# Patient Record
Sex: Female | Born: 1940
Health system: Southern US, Community
[De-identification: ages and names within clinical notes are randomized; demographics above are authoritative.]

## PROBLEM LIST (undated history)

## (undated) DIAGNOSIS — Z91018 Allergy to other foods: Secondary | ICD-10-CM

## (undated) DIAGNOSIS — T8859XA Other complications of anesthesia, initial encounter: Secondary | ICD-10-CM

## (undated) DIAGNOSIS — K5790 Diverticulosis of intestine, part unspecified, without perforation or abscess without bleeding: Secondary | ICD-10-CM

## (undated) DIAGNOSIS — R519 Headache, unspecified: Secondary | ICD-10-CM

## (undated) HISTORY — PX: COLONOSCOPY: SHX174

## (undated) HISTORY — PX: APPENDECTOMY: SHX54

## (undated) HISTORY — PX: TONSILLECTOMY: SUR1361

---

## 2002-11-17 ENCOUNTER — Encounter: Payer: Self-pay | Admitting: Emergency Medicine

## 2002-11-17 ENCOUNTER — Observation Stay (HOSPITAL_COMMUNITY): Admission: EM | Admit: 2002-11-17 | Discharge: 2002-11-18 | Payer: Self-pay | Admitting: Emergency Medicine

## 2002-11-18 ENCOUNTER — Encounter: Payer: Self-pay | Admitting: *Deleted

## 2002-11-18 ENCOUNTER — Encounter: Payer: Self-pay | Admitting: Family Medicine

## 2002-11-28 ENCOUNTER — Encounter: Payer: Self-pay | Admitting: Family Medicine

## 2002-11-28 ENCOUNTER — Encounter (HOSPITAL_COMMUNITY): Admission: RE | Admit: 2002-11-28 | Discharge: 2002-12-28 | Payer: Self-pay | Admitting: Family Medicine

## 2002-11-29 ENCOUNTER — Ambulatory Visit (HOSPITAL_COMMUNITY): Admission: RE | Admit: 2002-11-29 | Discharge: 2002-11-29 | Payer: Self-pay | Admitting: Family Medicine

## 2002-11-29 ENCOUNTER — Encounter: Payer: Self-pay | Admitting: Family Medicine

## 2005-01-16 ENCOUNTER — Ambulatory Visit (HOSPITAL_COMMUNITY): Admission: RE | Admit: 2005-01-16 | Discharge: 2005-01-16 | Payer: Self-pay | Admitting: Family Medicine

## 2005-03-05 ENCOUNTER — Ambulatory Visit: Payer: Self-pay | Admitting: Gastroenterology

## 2005-03-06 ENCOUNTER — Ambulatory Visit: Payer: Self-pay | Admitting: Gastroenterology

## 2005-03-17 ENCOUNTER — Ambulatory Visit: Payer: Self-pay | Admitting: Gastroenterology

## 2006-11-13 ENCOUNTER — Ambulatory Visit (HOSPITAL_COMMUNITY): Admission: RE | Admit: 2006-11-13 | Discharge: 2006-11-13 | Payer: Self-pay | Admitting: Family Medicine

## 2010-10-04 NOTE — Consult Note (Signed)
NAME:  Bridget Marquez, Bridget Marquez                          ACCOUNT NO.:  [id number]   MEDICAL RECORD NO.:  [id number]                   PATIENT TYPE:  INP   LOCATION:  A206                                 FACILITY:  [facility]   PHYSICIAN:  Vida Roller, M.D.                DATE OF BIRTH:  1940-11-02   DATE OF CONSULTATION:  11/18/2002  DATE OF DISCHARGE:                                   CONSULTATION   PRIMARY CARE Cimone Fahey:  Patrica Duel, M.D.   CARDIOLOGIST:  She has no cardiology evaluation prior.   HISTORY OF PRESENT ILLNESS:  Mrs. Altemose is a 70 year old woman with no  known coronary disease who presents with atypical chest discomfort which is  related to home environment stress.  She states she is caring for her  elderly, debilitated mother-in-law and finds that the stress involved in  that is probably what has brought on the chest discomfort.  She has no  exercise-related component.  It is a central fullness in her chest which has  lasted for many hours.  Was evaluated in the ER, given two sublingual  nitroglycerin and the second sublingual nitroglycerin resolved the pain.  There is no exertional component to the pain.  There is no PND, no  orthopnea, no lower extremity edema.  Minimal radiation to the pain.  No  diaphoresis or palpitations associated with it.   PAST MEDICAL HISTORY:  Significant for an abdominal hernia secondary to  trauma, an appendectomy at age 69, tonsillectomy at age 69.  She has no  significant coronary risk factors aside from a questionable family history  in her two siblings.   MEDICATIONS:  Medications prior to admission were multivitamins, co-enzyme  Q, glucosamine, gingko biloba.  Here in the hospital she is on Protonix 40  mg IV daily.   ALLERGIES:  1. PENICILLIN.  2. EGGS.  3. WHOLE WHEAT.  4. PEANUTS.  She thinks that she had some peanuts prior to the onset of the     discomfort but she is not sure.   SOCIAL HISTORY:  She lives in [place] with her  husband.  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  She  has one son who is alive and well, has reactive airways disease and a  history of intestinal parasites.  Mother died at age 38 of a myocardial  infarction.  Father died at age 23 of a stroke.  She has one brother who is  54 who is healthy, one brother who died at age 23 of suicide.  She has two  sisters both of whom are ill - one with a multitude of medical problems to  include a question of cardiac disease and another sister who has had  multiple traumas who also potentially has coronary disease although it is  uncertain.   REVIEW OF SYSTEMS:  Noncontributory.   PHYSICAL EXAMINATION:  GENERAL:  She is a well-developed, well-nourished  white female in no apparent distress who is alert and oriented x4.  VITAL SIGNS:  Pulse 68, respiratory rate 18, blood pressure 98/46, she is  saturating at 100% on 2 liters room air.  She weighs 137 pounds.  HEENT:  Normal.  NECK:  Without significant bruits or JVD.  She has no lymphadenopathy.  CHEST:  Clear to auscultation.  CARDIAC:  Reveals a regular rate and rhythm without murmurs, rubs, or  gallops.  ABDOMEN:  Soft, nontender, with normal active bowel sounds.  GENITOURINARY AND RECTAL:  Deferred.  EXTREMITIES:  Without significant clubbing, cyanosis, or edema.  NEUROLOGIC:  Nonfocal.   LABORATORY DATA:  Chest x-ray shows minor bronchitic changes but is  otherwise normal.  Her electrocardiogram reveals sinus rhythm at a rate of  60 with an incomplete right bundle-branch block.  She has inverted T waves  in the anterior precordium which are consistent with potentially a  persistent juvenile pattern.  There is no left axis deviation and the axis  is 30.  PR, QRS, and QT intervals are all normal and she has no ST-T wave  changes concerning for ischemia.   White blood cell count 5.5; H&H are 13 and 39 with a platelet count of  228,000.   Sodium 138, potassium 4.0, chloride 103, bicarb 28, BUN 13,  creatinine 0.9, blood sugar 108 nonfasting.  Her liver function studies are  normal.  Three sets are cardiac enzymes are inconsistent with acute  myocardial infarction.  Her D-dimer is 0.23.  PT, PTT, and INR are all  normal.   So, essentially we have a woman with atypical chest pain who has ruled out  for a myocardial infarction, has a question of cholesterol elevation as well  as a family history.  Recommendation is to get an exercise Cardiolite.  If  this is normal then would not pursue an ischemic etiology to the discomfort.  Probably get a fasting cholesterol to judicate whether or not she has  hypercholesterolemia and I would add a low dose of aspirin in the interim  period of time.  Understand she is also undergoing a GI workup for the  etiology of this discomfort.                                               Vida Roller, M.D.    JH/MEDQ  D:  11/18/2002  T:  11/18/2002  Job:  [id number]

## 2010-10-04 NOTE — H&P (Signed)
NAME:  Bridget Marquez, Bridget Marquez                          ACCOUNT NO.:  [id number]   MEDICAL RECORD NO.:  [id number]                   PATIENT TYPE:  INP   LOCATION:  A206                                 FACILITY:  [facility]   PHYSICIAN:  Patrica Duel, M.D.                 DATE OF BIRTH:  [birth year]   DATE OF ADMISSION:  11/17/2002  DATE OF DISCHARGE:                                HISTORY & PHYSICAL   CHIEF COMPLAINT:  Chest pain and shortness of breath.   HISTORY OF PRESENT ILLNESS:  This is a 70 year old female with a history of  ? hiatal hernia.  She denies chest pain approximately 15 years ago and  underwent cardiac catheterization at [facility].  This was negative.  She  has a healthy lifestyle and otherwise few risk factors for coronary disease.   The patient has a one-month history of intermittent nausea, left anterior  and epigastric abdominal pain with occasional diaphoresis.  She also has  decreased exercise tolerance.  She has no absolutely reproducible symptoms.  She noticed the onset after eating peanuts.  She has a very stressful  existence, caring for her ill mother-in-law.   The patient presented to the emergency room with the above complaints.  Workup there was essentially benign except for poor R-wave progression noted  on her EKG.  Chest x-ray, etc., were benign.  Cardiac enzymes negative.  The  patient was admitted for rule out MI protocol, further evaluation as  indicated.   There is no history of headache, neurologic deficits, diarrhea, hematemesis,  hematochezia, genitourinary symptoms.  No history of neurologic deficits.   Since admission the patient is much better after having received 0.5 mg of  Xanax, nitroglycerin sublingually x2, as well as Protonix IV.   CURRENT MEDICATIONS:  None except for vitamins including CoQ10.   ALLERGIES:  PENICILLIN.   PAST MEDICAL HISTORY:  As noted above.  She is also status post  appendectomy.   SOCIAL HISTORY:  Nonsmoker,  nondrinker.  Very active physically.   REVIEW OF SYSTEMS:  Negative except as mentioned.   FAMILY HISTORY:  Noncontributory.   PHYSICAL EXAMINATION:  GENERAL:  Very pleasant female who is alert and  oriented, in no acute distress at this time.  VITAL SIGNS:  Within normal limits.  HEENT:  Normocephalic, atraumatic.  Pupils are equal.  Ears, nose, throat  are benign.  NECK:  Supple.  No bruits noted.  No thyromegaly or lymphadenopathy.  LUNGS:  Clear.  HEART:  Heart sounds are normal.  Without murmurs, rubs, or gallops.  ABDOMEN:  Nontender, nondistended.  There are no masses or organomegaly  demonstrated.  EXTREMITIES:  No clubbing, cyanosis, or edema.  NEUROLOGIC:  Within normal limits.   LABORATORY DATA:  All laboratories within normal limits including CBC,  protime, liver functions, MET-7, and cardiac enzymes.   EKG:  Negative for acute change although there is poor  R-wave progression.  QT interval is short for rate.   Chest x-ray:  Minor bronchitic changes.  Negative for other abnormality.   ASSESSMENT:  Symptom complex as described above, questionable etiology.  Consider gastrointestinal source.  Also must rule out coronary disease.  Consider gastrointestinal source such as gastroesophageal reflux disease  with esophagitis, spasm, gallbladder disease, or other.   PLAN:  Empiric PPIs.  Cardiac evaluation with rule out MI protocol, to  proceed with ultrasound of the gallbladder and possible GI consult with  upper endoscopy or whatever is deemed appropriate after cardiac evaluation  is at least initiated.  Will follow and treat expectantly.                                               Patrica Duel, M.D.    MC/MEDQ  D:  11/17/2002  T:  11/18/2002  Job:  [id number]

## 2015-11-22 DIAGNOSIS — Z6824 Body mass index (BMI) 24.0-24.9, adult: Secondary | ICD-10-CM | POA: Diagnosis not present

## 2015-11-22 DIAGNOSIS — Z299 Encounter for prophylactic measures, unspecified: Secondary | ICD-10-CM | POA: Diagnosis not present

## 2015-11-22 DIAGNOSIS — Z1211 Encounter for screening for malignant neoplasm of colon: Secondary | ICD-10-CM | POA: Diagnosis not present

## 2015-11-22 DIAGNOSIS — Z7189 Other specified counseling: Secondary | ICD-10-CM | POA: Diagnosis not present

## 2015-11-22 DIAGNOSIS — Z1389 Encounter for screening for other disorder: Secondary | ICD-10-CM | POA: Diagnosis not present

## 2015-11-22 DIAGNOSIS — Z0000 Encounter for general adult medical examination without abnormal findings: Secondary | ICD-10-CM | POA: Diagnosis not present

## 2015-11-27 DIAGNOSIS — Z79899 Other long term (current) drug therapy: Secondary | ICD-10-CM | POA: Diagnosis not present

## 2015-11-27 DIAGNOSIS — R5383 Other fatigue: Secondary | ICD-10-CM | POA: Diagnosis not present

## 2016-12-02 DIAGNOSIS — Z713 Dietary counseling and surveillance: Secondary | ICD-10-CM | POA: Diagnosis not present

## 2016-12-02 DIAGNOSIS — Z1211 Encounter for screening for malignant neoplasm of colon: Secondary | ICD-10-CM | POA: Diagnosis not present

## 2016-12-02 DIAGNOSIS — Z6822 Body mass index (BMI) 22.0-22.9, adult: Secondary | ICD-10-CM | POA: Diagnosis not present

## 2016-12-02 DIAGNOSIS — R5383 Other fatigue: Secondary | ICD-10-CM | POA: Diagnosis not present

## 2016-12-02 DIAGNOSIS — Z79899 Other long term (current) drug therapy: Secondary | ICD-10-CM | POA: Diagnosis not present

## 2016-12-02 DIAGNOSIS — Z0000 Encounter for general adult medical examination without abnormal findings: Secondary | ICD-10-CM | POA: Diagnosis not present

## 2016-12-02 DIAGNOSIS — Z1389 Encounter for screening for other disorder: Secondary | ICD-10-CM | POA: Diagnosis not present

## 2016-12-02 DIAGNOSIS — Z7189 Other specified counseling: Secondary | ICD-10-CM | POA: Diagnosis not present

## 2016-12-02 DIAGNOSIS — Z299 Encounter for prophylactic measures, unspecified: Secondary | ICD-10-CM | POA: Diagnosis not present

## 2016-12-03 DIAGNOSIS — R5383 Other fatigue: Secondary | ICD-10-CM | POA: Diagnosis not present

## 2016-12-03 DIAGNOSIS — Z79899 Other long term (current) drug therapy: Secondary | ICD-10-CM | POA: Diagnosis not present

## 2017-01-23 DIAGNOSIS — M546 Pain in thoracic spine: Secondary | ICD-10-CM | POA: Diagnosis not present

## 2017-01-23 DIAGNOSIS — S336XXA Sprain of sacroiliac joint, initial encounter: Secondary | ICD-10-CM | POA: Diagnosis not present

## 2017-01-23 DIAGNOSIS — M9903 Segmental and somatic dysfunction of lumbar region: Secondary | ICD-10-CM | POA: Diagnosis not present

## 2017-01-23 DIAGNOSIS — M47816 Spondylosis without myelopathy or radiculopathy, lumbar region: Secondary | ICD-10-CM | POA: Diagnosis not present

## 2017-01-23 DIAGNOSIS — M9901 Segmental and somatic dysfunction of cervical region: Secondary | ICD-10-CM | POA: Diagnosis not present

## 2017-01-23 DIAGNOSIS — S39012A Strain of muscle, fascia and tendon of lower back, initial encounter: Secondary | ICD-10-CM | POA: Diagnosis not present

## 2017-01-23 DIAGNOSIS — M9902 Segmental and somatic dysfunction of thoracic region: Secondary | ICD-10-CM | POA: Diagnosis not present

## 2017-01-23 DIAGNOSIS — M47812 Spondylosis without myelopathy or radiculopathy, cervical region: Secondary | ICD-10-CM | POA: Diagnosis not present

## 2017-09-29 DIAGNOSIS — L821 Other seborrheic keratosis: Secondary | ICD-10-CM | POA: Diagnosis not present

## 2017-09-29 DIAGNOSIS — D229 Melanocytic nevi, unspecified: Secondary | ICD-10-CM | POA: Diagnosis not present

## 2017-09-29 DIAGNOSIS — C44612 Basal cell carcinoma of skin of right upper limb, including shoulder: Secondary | ICD-10-CM | POA: Diagnosis not present

## 2017-10-29 DIAGNOSIS — C44612 Basal cell carcinoma of skin of right upper limb, including shoulder: Secondary | ICD-10-CM | POA: Diagnosis not present

## 2017-12-09 DIAGNOSIS — Z7189 Other specified counseling: Secondary | ICD-10-CM | POA: Diagnosis not present

## 2017-12-09 DIAGNOSIS — Z6821 Body mass index (BMI) 21.0-21.9, adult: Secondary | ICD-10-CM | POA: Diagnosis not present

## 2017-12-09 DIAGNOSIS — Z1211 Encounter for screening for malignant neoplasm of colon: Secondary | ICD-10-CM | POA: Diagnosis not present

## 2017-12-09 DIAGNOSIS — Z0000 Encounter for general adult medical examination without abnormal findings: Secondary | ICD-10-CM | POA: Diagnosis not present

## 2017-12-09 DIAGNOSIS — Z1339 Encounter for screening examination for other mental health and behavioral disorders: Secondary | ICD-10-CM | POA: Diagnosis not present

## 2017-12-09 DIAGNOSIS — E2839 Other primary ovarian failure: Secondary | ICD-10-CM | POA: Diagnosis not present

## 2017-12-09 DIAGNOSIS — R5383 Other fatigue: Secondary | ICD-10-CM | POA: Diagnosis not present

## 2017-12-09 DIAGNOSIS — Z299 Encounter for prophylactic measures, unspecified: Secondary | ICD-10-CM | POA: Diagnosis not present

## 2017-12-09 DIAGNOSIS — E7800 Pure hypercholesterolemia, unspecified: Secondary | ICD-10-CM | POA: Diagnosis not present

## 2017-12-09 DIAGNOSIS — Z1331 Encounter for screening for depression: Secondary | ICD-10-CM | POA: Diagnosis not present

## 2017-12-10 DIAGNOSIS — E7800 Pure hypercholesterolemia, unspecified: Secondary | ICD-10-CM | POA: Diagnosis not present

## 2017-12-10 DIAGNOSIS — R5383 Other fatigue: Secondary | ICD-10-CM | POA: Diagnosis not present

## 2017-12-10 DIAGNOSIS — Z79899 Other long term (current) drug therapy: Secondary | ICD-10-CM | POA: Diagnosis not present

## 2018-07-22 DIAGNOSIS — R42 Dizziness and giddiness: Secondary | ICD-10-CM | POA: Diagnosis not present

## 2018-07-22 DIAGNOSIS — R531 Weakness: Secondary | ICD-10-CM | POA: Diagnosis not present

## 2018-07-22 DIAGNOSIS — R61 Generalized hyperhidrosis: Secondary | ICD-10-CM | POA: Diagnosis not present

## 2018-07-22 DIAGNOSIS — H538 Other visual disturbances: Secondary | ICD-10-CM | POA: Diagnosis not present

## 2018-07-22 DIAGNOSIS — R079 Chest pain, unspecified: Secondary | ICD-10-CM | POA: Diagnosis not present

## 2018-07-22 DIAGNOSIS — K5730 Diverticulosis of large intestine without perforation or abscess without bleeding: Secondary | ICD-10-CM | POA: Diagnosis not present

## 2018-07-22 DIAGNOSIS — R0789 Other chest pain: Secondary | ICD-10-CM | POA: Diagnosis not present

## 2018-07-22 DIAGNOSIS — R1013 Epigastric pain: Secondary | ICD-10-CM | POA: Diagnosis not present

## 2018-07-22 DIAGNOSIS — J479 Bronchiectasis, uncomplicated: Secondary | ICD-10-CM | POA: Diagnosis not present

## 2018-07-22 DIAGNOSIS — K8020 Calculus of gallbladder without cholecystitis without obstruction: Secondary | ICD-10-CM | POA: Diagnosis not present

## 2018-07-22 DIAGNOSIS — K5900 Constipation, unspecified: Secondary | ICD-10-CM | POA: Diagnosis not present

## 2018-12-15 DIAGNOSIS — Z0000 Encounter for general adult medical examination without abnormal findings: Secondary | ICD-10-CM | POA: Diagnosis not present

## 2018-12-15 DIAGNOSIS — E7800 Pure hypercholesterolemia, unspecified: Secondary | ICD-10-CM | POA: Diagnosis not present

## 2018-12-15 DIAGNOSIS — Z1211 Encounter for screening for malignant neoplasm of colon: Secondary | ICD-10-CM | POA: Diagnosis not present

## 2018-12-15 DIAGNOSIS — E2839 Other primary ovarian failure: Secondary | ICD-10-CM | POA: Diagnosis not present

## 2018-12-15 DIAGNOSIS — R5383 Other fatigue: Secondary | ICD-10-CM | POA: Diagnosis not present

## 2018-12-15 DIAGNOSIS — Z6821 Body mass index (BMI) 21.0-21.9, adult: Secondary | ICD-10-CM | POA: Diagnosis not present

## 2018-12-15 DIAGNOSIS — Z1339 Encounter for screening examination for other mental health and behavioral disorders: Secondary | ICD-10-CM | POA: Diagnosis not present

## 2018-12-15 DIAGNOSIS — Z1331 Encounter for screening for depression: Secondary | ICD-10-CM | POA: Diagnosis not present

## 2018-12-15 DIAGNOSIS — Z299 Encounter for prophylactic measures, unspecified: Secondary | ICD-10-CM | POA: Diagnosis not present

## 2018-12-15 DIAGNOSIS — Z7189 Other specified counseling: Secondary | ICD-10-CM | POA: Diagnosis not present

## 2018-12-17 DIAGNOSIS — R5383 Other fatigue: Secondary | ICD-10-CM | POA: Diagnosis not present

## 2018-12-17 DIAGNOSIS — E7800 Pure hypercholesterolemia, unspecified: Secondary | ICD-10-CM | POA: Diagnosis not present

## 2018-12-17 DIAGNOSIS — Z79899 Other long term (current) drug therapy: Secondary | ICD-10-CM | POA: Diagnosis not present

## 2018-12-17 DIAGNOSIS — E559 Vitamin D deficiency, unspecified: Secondary | ICD-10-CM | POA: Diagnosis not present

## 2019-12-21 DIAGNOSIS — H1131 Conjunctival hemorrhage, right eye: Secondary | ICD-10-CM | POA: Diagnosis not present

## 2019-12-21 DIAGNOSIS — H2513 Age-related nuclear cataract, bilateral: Secondary | ICD-10-CM | POA: Diagnosis not present

## 2019-12-21 DIAGNOSIS — H524 Presbyopia: Secondary | ICD-10-CM | POA: Diagnosis not present

## 2019-12-21 DIAGNOSIS — H52221 Regular astigmatism, right eye: Secondary | ICD-10-CM | POA: Diagnosis not present

## 2019-12-21 DIAGNOSIS — H5203 Hypermetropia, bilateral: Secondary | ICD-10-CM | POA: Diagnosis not present

## 2019-12-28 DIAGNOSIS — Z299 Encounter for prophylactic measures, unspecified: Secondary | ICD-10-CM | POA: Diagnosis not present

## 2019-12-28 DIAGNOSIS — I6529 Occlusion and stenosis of unspecified carotid artery: Secondary | ICD-10-CM | POA: Diagnosis not present

## 2019-12-28 DIAGNOSIS — E559 Vitamin D deficiency, unspecified: Secondary | ICD-10-CM | POA: Diagnosis not present

## 2019-12-28 DIAGNOSIS — R5383 Other fatigue: Secondary | ICD-10-CM | POA: Diagnosis not present

## 2019-12-28 DIAGNOSIS — Z7189 Other specified counseling: Secondary | ICD-10-CM | POA: Diagnosis not present

## 2019-12-28 DIAGNOSIS — Z1331 Encounter for screening for depression: Secondary | ICD-10-CM | POA: Diagnosis not present

## 2019-12-28 DIAGNOSIS — Z0000 Encounter for general adult medical examination without abnormal findings: Secondary | ICD-10-CM | POA: Diagnosis not present

## 2019-12-28 DIAGNOSIS — Z1339 Encounter for screening examination for other mental health and behavioral disorders: Secondary | ICD-10-CM | POA: Diagnosis not present

## 2019-12-29 DIAGNOSIS — E7800 Pure hypercholesterolemia, unspecified: Secondary | ICD-10-CM | POA: Diagnosis not present

## 2019-12-29 DIAGNOSIS — Z79899 Other long term (current) drug therapy: Secondary | ICD-10-CM | POA: Diagnosis not present

## 2019-12-29 DIAGNOSIS — R5383 Other fatigue: Secondary | ICD-10-CM | POA: Diagnosis not present

## 2019-12-29 DIAGNOSIS — E559 Vitamin D deficiency, unspecified: Secondary | ICD-10-CM | POA: Diagnosis not present

## 2020-01-16 DIAGNOSIS — I6529 Occlusion and stenosis of unspecified carotid artery: Secondary | ICD-10-CM | POA: Diagnosis not present

## 2020-02-10 DIAGNOSIS — R471 Dysarthria and anarthria: Secondary | ICD-10-CM | POA: Diagnosis not present

## 2020-02-10 DIAGNOSIS — R519 Headache, unspecified: Secondary | ICD-10-CM | POA: Diagnosis not present

## 2020-02-10 DIAGNOSIS — Z299 Encounter for prophylactic measures, unspecified: Secondary | ICD-10-CM | POA: Diagnosis not present

## 2020-02-16 DIAGNOSIS — G43909 Migraine, unspecified, not intractable, without status migrainosus: Secondary | ICD-10-CM | POA: Diagnosis not present

## 2020-02-27 DIAGNOSIS — G500 Trigeminal neuralgia: Secondary | ICD-10-CM | POA: Diagnosis not present

## 2020-02-27 DIAGNOSIS — I739 Peripheral vascular disease, unspecified: Secondary | ICD-10-CM | POA: Diagnosis not present

## 2020-02-27 DIAGNOSIS — H9319 Tinnitus, unspecified ear: Secondary | ICD-10-CM | POA: Diagnosis not present

## 2020-02-27 DIAGNOSIS — Z299 Encounter for prophylactic measures, unspecified: Secondary | ICD-10-CM | POA: Diagnosis not present

## 2020-03-16 DIAGNOSIS — R21 Rash and other nonspecific skin eruption: Secondary | ICD-10-CM | POA: Diagnosis not present

## 2020-03-16 DIAGNOSIS — W57XXXA Bitten or stung by nonvenomous insect and other nonvenomous arthropods, initial encounter: Secondary | ICD-10-CM | POA: Diagnosis not present

## 2020-03-16 DIAGNOSIS — Z2821 Immunization not carried out because of patient refusal: Secondary | ICD-10-CM | POA: Diagnosis not present

## 2020-03-16 DIAGNOSIS — I739 Peripheral vascular disease, unspecified: Secondary | ICD-10-CM | POA: Diagnosis not present

## 2020-03-16 DIAGNOSIS — Z91014 Allergy to mammalian meats: Secondary | ICD-10-CM | POA: Diagnosis not present

## 2020-03-16 DIAGNOSIS — Z299 Encounter for prophylactic measures, unspecified: Secondary | ICD-10-CM | POA: Diagnosis not present

## 2020-05-30 DIAGNOSIS — Z299 Encounter for prophylactic measures, unspecified: Secondary | ICD-10-CM | POA: Diagnosis not present

## 2020-05-30 DIAGNOSIS — K219 Gastro-esophageal reflux disease without esophagitis: Secondary | ICD-10-CM | POA: Diagnosis not present

## 2020-05-30 DIAGNOSIS — J069 Acute upper respiratory infection, unspecified: Secondary | ICD-10-CM | POA: Diagnosis not present

## 2020-07-20 DIAGNOSIS — R109 Unspecified abdominal pain: Secondary | ICD-10-CM | POA: Diagnosis not present

## 2020-07-20 DIAGNOSIS — Z299 Encounter for prophylactic measures, unspecified: Secondary | ICD-10-CM | POA: Diagnosis not present

## 2020-07-20 DIAGNOSIS — R519 Headache, unspecified: Secondary | ICD-10-CM | POA: Diagnosis not present

## 2020-07-20 DIAGNOSIS — I739 Peripheral vascular disease, unspecified: Secondary | ICD-10-CM | POA: Diagnosis not present

## 2020-07-27 DIAGNOSIS — I672 Cerebral atherosclerosis: Secondary | ICD-10-CM | POA: Diagnosis not present

## 2020-07-27 DIAGNOSIS — R109 Unspecified abdominal pain: Secondary | ICD-10-CM | POA: Diagnosis not present

## 2020-07-27 DIAGNOSIS — Z20822 Contact with and (suspected) exposure to covid-19: Secondary | ICD-10-CM | POA: Diagnosis not present

## 2020-07-27 DIAGNOSIS — R4701 Aphasia: Secondary | ICD-10-CM | POA: Diagnosis not present

## 2020-07-27 DIAGNOSIS — Z882 Allergy status to sulfonamides status: Secondary | ICD-10-CM | POA: Diagnosis not present

## 2020-07-27 DIAGNOSIS — R4182 Altered mental status, unspecified: Secondary | ICD-10-CM | POA: Diagnosis not present

## 2020-07-27 DIAGNOSIS — R4789 Other speech disturbances: Secondary | ICD-10-CM | POA: Diagnosis not present

## 2020-07-27 DIAGNOSIS — Z881 Allergy status to other antibiotic agents status: Secondary | ICD-10-CM | POA: Diagnosis not present

## 2020-07-27 DIAGNOSIS — R1110 Vomiting, unspecified: Secondary | ICD-10-CM | POA: Diagnosis not present

## 2020-07-27 DIAGNOSIS — Z887 Allergy status to serum and vaccine status: Secondary | ICD-10-CM | POA: Diagnosis not present

## 2020-07-27 DIAGNOSIS — R519 Headache, unspecified: Secondary | ICD-10-CM | POA: Diagnosis not present

## 2020-07-27 DIAGNOSIS — I6523 Occlusion and stenosis of bilateral carotid arteries: Secondary | ICD-10-CM | POA: Diagnosis not present

## 2020-07-27 DIAGNOSIS — Z880 Allergy status to penicillin: Secondary | ICD-10-CM | POA: Diagnosis not present

## 2020-07-27 DIAGNOSIS — G43709 Chronic migraine without aura, not intractable, without status migrainosus: Secondary | ICD-10-CM | POA: Diagnosis not present

## 2020-07-27 DIAGNOSIS — K862 Cyst of pancreas: Secondary | ICD-10-CM | POA: Diagnosis not present

## 2020-07-27 DIAGNOSIS — K8020 Calculus of gallbladder without cholecystitis without obstruction: Secondary | ICD-10-CM | POA: Diagnosis not present

## 2020-07-27 DIAGNOSIS — E876 Hypokalemia: Secondary | ICD-10-CM | POA: Diagnosis not present

## 2020-07-27 DIAGNOSIS — K7689 Other specified diseases of liver: Secondary | ICD-10-CM | POA: Diagnosis not present

## 2020-07-27 DIAGNOSIS — I700 Atherosclerosis of aorta: Secondary | ICD-10-CM | POA: Diagnosis not present

## 2020-07-27 DIAGNOSIS — K219 Gastro-esophageal reflux disease without esophagitis: Secondary | ICD-10-CM | POA: Diagnosis not present

## 2020-07-28 DIAGNOSIS — Z91014 Allergy to mammalian meats: Secondary | ICD-10-CM | POA: Diagnosis not present

## 2020-07-28 DIAGNOSIS — K862 Cyst of pancreas: Secondary | ICD-10-CM | POA: Diagnosis present

## 2020-07-28 DIAGNOSIS — G43909 Migraine, unspecified, not intractable, without status migrainosus: Secondary | ICD-10-CM | POA: Diagnosis not present

## 2020-07-28 DIAGNOSIS — K7689 Other specified diseases of liver: Secondary | ICD-10-CM | POA: Diagnosis present

## 2020-07-28 DIAGNOSIS — Z887 Allergy status to serum and vaccine status: Secondary | ICD-10-CM | POA: Diagnosis not present

## 2020-07-28 DIAGNOSIS — E876 Hypokalemia: Secondary | ICD-10-CM | POA: Diagnosis not present

## 2020-07-28 DIAGNOSIS — Z91018 Allergy to other foods: Secondary | ICD-10-CM | POA: Diagnosis not present

## 2020-07-28 DIAGNOSIS — I700 Atherosclerosis of aorta: Secondary | ICD-10-CM | POA: Diagnosis not present

## 2020-07-28 DIAGNOSIS — G43709 Chronic migraine without aura, not intractable, without status migrainosus: Secondary | ICD-10-CM | POA: Diagnosis present

## 2020-07-28 DIAGNOSIS — R4789 Other speech disturbances: Secondary | ICD-10-CM | POA: Diagnosis not present

## 2020-07-28 DIAGNOSIS — I672 Cerebral atherosclerosis: Secondary | ICD-10-CM | POA: Diagnosis not present

## 2020-07-28 DIAGNOSIS — I6523 Occlusion and stenosis of bilateral carotid arteries: Secondary | ICD-10-CM | POA: Diagnosis not present

## 2020-07-28 DIAGNOSIS — K8020 Calculus of gallbladder without cholecystitis without obstruction: Secondary | ICD-10-CM | POA: Diagnosis not present

## 2020-07-28 DIAGNOSIS — K219 Gastro-esophageal reflux disease without esophagitis: Secondary | ICD-10-CM | POA: Diagnosis present

## 2020-07-28 DIAGNOSIS — R7989 Other specified abnormal findings of blood chemistry: Secondary | ICD-10-CM | POA: Diagnosis not present

## 2020-07-28 DIAGNOSIS — R1110 Vomiting, unspecified: Secondary | ICD-10-CM | POA: Diagnosis not present

## 2020-07-28 DIAGNOSIS — R4701 Aphasia: Secondary | ICD-10-CM | POA: Diagnosis not present

## 2020-07-28 DIAGNOSIS — Z882 Allergy status to sulfonamides status: Secondary | ICD-10-CM | POA: Diagnosis not present

## 2020-07-28 DIAGNOSIS — R109 Unspecified abdominal pain: Secondary | ICD-10-CM | POA: Diagnosis not present

## 2020-07-28 DIAGNOSIS — Z20822 Contact with and (suspected) exposure to covid-19: Secondary | ICD-10-CM | POA: Diagnosis not present

## 2020-07-28 DIAGNOSIS — Z880 Allergy status to penicillin: Secondary | ICD-10-CM | POA: Diagnosis not present

## 2020-07-28 DIAGNOSIS — R519 Headache, unspecified: Secondary | ICD-10-CM | POA: Diagnosis not present

## 2020-07-28 DIAGNOSIS — Z8616 Personal history of COVID-19: Secondary | ICD-10-CM | POA: Diagnosis not present

## 2020-07-28 DIAGNOSIS — R4182 Altered mental status, unspecified: Secondary | ICD-10-CM | POA: Diagnosis not present

## 2020-07-28 DIAGNOSIS — Z881 Allergy status to other antibiotic agents status: Secondary | ICD-10-CM | POA: Diagnosis not present

## 2020-07-28 DIAGNOSIS — R112 Nausea with vomiting, unspecified: Secondary | ICD-10-CM | POA: Diagnosis not present

## 2020-08-02 DIAGNOSIS — Z299 Encounter for prophylactic measures, unspecified: Secondary | ICD-10-CM | POA: Diagnosis not present

## 2020-08-02 DIAGNOSIS — R109 Unspecified abdominal pain: Secondary | ICD-10-CM | POA: Diagnosis not present

## 2020-08-02 DIAGNOSIS — Z09 Encounter for follow-up examination after completed treatment for conditions other than malignant neoplasm: Secondary | ICD-10-CM | POA: Diagnosis not present

## 2020-08-02 DIAGNOSIS — R4182 Altered mental status, unspecified: Secondary | ICD-10-CM | POA: Diagnosis not present

## 2020-08-06 DIAGNOSIS — Z299 Encounter for prophylactic measures, unspecified: Secondary | ICD-10-CM | POA: Diagnosis not present

## 2020-08-06 DIAGNOSIS — K8020 Calculus of gallbladder without cholecystitis without obstruction: Secondary | ICD-10-CM | POA: Diagnosis not present

## 2020-08-06 DIAGNOSIS — I700 Atherosclerosis of aorta: Secondary | ICD-10-CM | POA: Diagnosis not present

## 2020-08-08 DIAGNOSIS — R309 Painful micturition, unspecified: Secondary | ICD-10-CM | POA: Diagnosis not present

## 2020-08-08 DIAGNOSIS — R102 Pelvic and perineal pain: Secondary | ICD-10-CM | POA: Diagnosis not present

## 2020-08-08 DIAGNOSIS — Z6820 Body mass index (BMI) 20.0-20.9, adult: Secondary | ICD-10-CM | POA: Diagnosis not present

## 2020-08-08 DIAGNOSIS — N8500 Endometrial hyperplasia, unspecified: Secondary | ICD-10-CM | POA: Diagnosis not present

## 2020-08-13 ENCOUNTER — Ambulatory Visit: Payer: Self-pay | Admitting: Surgery

## 2020-08-13 DIAGNOSIS — K8010 Calculus of gallbladder with chronic cholecystitis without obstruction: Secondary | ICD-10-CM | POA: Diagnosis not present

## 2020-08-17 NOTE — Patient Instructions (Addendum)
DUE TO COVID-19 ONLY ONE VISITOR IS ALLOWED TO COME WITH YOU AND STAY IN THE WAITING ROOM ONLY DURING PRE OP AND PROCEDURE.   **NO VISITORS ARE ALLOWED IN THE SHORT STAY AREA OR RECOVERY ROOM!!**  IF YOU WILL BE ADMITTED INTO THE HOSPITAL YOU ARE ALLOWED ONLY TWO SUPPORT PEOPLE DURING VISITATION HOURS ONLY (10AM -8PM)   . The support person(s) may change daily. . The support person(s) must pass our screening, gel in and out, and wear a mask at all times, including in the patient's room. . Patients must also wear a mask when staff or their support person are in the room.  No visitors under the age of 65. Any visitor under the age of 21 must be accompanied by an adult.    COVID SWAB TESTING MUST BE COMPLETED ON:  Monday, 08-20-20 @ 2:30 PM   [address] [place], [place] [address]  (Must self quarantine after testing. Follow instructions on handout.)        Your procedure is scheduled on:  Wednesday, 08-22-20   Report to [facility] Main  Entrance    Report to admitting at 9:00 AM   Call this number if you have problems the morning of surgery [phone]   Do not eat food :After Midnight.   May have liquids until 8:00 AM day of surgery  CLEAR LIQUID DIET  Foods Allowed                                                                     Foods Excluded  Water, Black Coffee and tea, regular and decaf               liquids that you cannot  Plain Jell-O in any flavor  (No red)                                     see through such as: Fruit ices (not with fruit pulp)                                      milk, soups, orange juice              Iced Popsicles (No red)                                      All solid food                                   Apple juices Sports drinks like Gatorade (No red) Lightly seasoned clear broth or consume(fat free) Sugar, honey syrup   Oral Hygiene is also important to reduce your risk of infection.                                    Remember  - BRUSH YOUR TEETH THE MORNING OF SURGERY  WITH YOUR REGULAR TOOTHPASTE   Do NOT smoke after Midnight   Take these medicines the morning of surgery with A SIP OF WATER: None                              You may not have any metal on your body including hair pins, jewelry, and body piercings             Do not wear make-up, lotions, powders, perfumes/cologne, or deodorant             Do not wear nail polish.  Do not shave  48 hours prior to surgery.      Do not bring valuables to the hospital. [place].   Contacts, dentures or bridgework may not be worn into surgery.   Bring small overnight bag day of surgery.                 Please read over the following fact sheets you were given: IF YOU HAVE QUESTIONS ABOUT YOUR PRE OP INSTRUCTIONS PLEASE CALL  [facility] - Preparing for Surgery Before surgery, you can play an important role.  Because skin is not sterile, your skin needs to be as free of germs as possible.  You can reduce the number of germs on your skin by washing with CHG (chlorahexidine gluconate) soap before surgery.  CHG is an antiseptic cleaner which kills germs and bonds with the skin to continue killing germs even after washing. Please DO NOT use if you have an allergy to CHG or antibacterial soaps.  If your skin becomes reddened/irritated stop using the CHG and inform your nurse when you arrive at Short Stay. Do not shave (including legs and underarms) for at least 48 hours prior to the first CHG shower.  You may shave your face/neck.  Please follow these instructions carefully:  1.  Shower with CHG Soap the night before surgery and the  morning of surgery.  2.  If you choose to wash your hair, wash your hair first as usual with your normal  shampoo.  3.  After you shampoo, rinse your hair and body thoroughly to remove the shampoo.                             4.  Use CHG as you would any other liquid soap.   You can apply chg directly to the skin and wash.  Gently with a scrungie or clean washcloth.  5.  Apply the CHG Soap to your body ONLY FROM THE NECK DOWN.   Do   not use on face/ open                           Wound or open sores. Avoid contact with eyes, ears mouth and   genitals (private parts).                       Wash face,  Genitals (private parts) with your normal soap.             6.  Wash thoroughly, paying special attention to the area where your    surgery  will be performed.  7.  Thoroughly rinse  your body with warm water from the neck down.  8.  DO NOT shower/wash with your normal soap after using and rinsing off the CHG Soap.                9.  Pat yourself dry with a clean towel.            10.  Wear clean pajamas.            11.  Place clean sheets on your bed the night of your first shower and do not  sleep with pets. Day of Surgery : Do not apply any lotions/deodorants the morning of surgery.  Please wear clean clothes to the hospital/surgery center.  FAILURE TO FOLLOW THESE INSTRUCTIONS MAY RESULT IN THE CANCELLATION OF YOUR SURGERY  PATIENT SIGNATURE_________________________________  NURSE SIGNATURE__________________________________  ________________________________________________________________________

## 2020-08-17 NOTE — Progress Notes (Addendum)
COVID Vaccine Completed: No Date COVID Vaccine completed: Has received booster: COVID vaccine manufacturer: [employer]   Date of COVID positive in last 90 days: N/A  PCP - Jerene Bears, MD Cardiologist - N/A  Chest x-ray - 07-28-20 Care Everywhere EKG - N/A Stress Test - 2004 Epic ECHO -  Cardiac Cath -  Pacemaker/ICD device last checked: Spinal Cord Stimulator:  Sleep Study - N/A CPAP -   Fasting Blood Sugar - N/A Checks Blood Sugar _____ times a day  Blood Thinner Instructions: N/A Aspirin Instructions: Last Dose:  Activity level:  Can go up a flight of stairs and perform activities of daily living without stopping and without symptoms of chest pain or shortness of breath.    Able to exercise without symptoms  Anesthesia review: Recent hospitalization for altered mental status and expressive aphasia.    Patient denies shortness of breath, fever, cough and chest pain at PAT appointment   Patient verbalized understanding of instructions that were given to them at the PAT appointment. Patient was also instructed that they will need to review over the PAT instructions again at home before surgery.

## 2020-08-19 ENCOUNTER — Encounter (HOSPITAL_COMMUNITY): Payer: Self-pay | Admitting: Surgery

## 2020-08-19 DIAGNOSIS — K8010 Calculus of gallbladder with chronic cholecystitis without obstruction: Secondary | ICD-10-CM | POA: Diagnosis present

## 2020-08-19 NOTE — H&P (Signed)
[facility]  Bridget Marquez DOB: May 17, 1941 Married / Language: English / Race: White Female   History of Present Illness   The patient is a 80 year old female who presents for evaluation of gall stones.  CHIEF COMPLAINT: symptomatic gallstones  Patient is referred by Dr. Jerene Bears for surgical evaluation and management of symptomatic cholelithiasis and chronic cholecystitis. Patient has had intermittent symptoms since January 2022. She has had some severe episodes of right upper quadrant abdominal pain associated with nausea and vomiting. Patient had an ultrasound study performed on July 28, 2020. This showed multiple gallstones measuring up to 18 mm in diameter. There was no sign of acute infection or inflammation. There was a benign cyst in the liver and another benign cyst in the tail of the pancreas which were stable from prior studies. Patient denies any history of jaundice. She may have had fevers and chills. She has no history of hepatocellular disease or hepatitis. Previous abdominal surgery includes laparoscopy and appendectomy. Patient and her son who accompanies her today described multiple other symptoms including loss of consciousness, headaches, and possible strokelike symptoms. Apparently she was evaluated at [facility] in [place], [place], including imaging studies all of which were unrevealing. Patient is now referred by her primary care physician for cholecystectomy for symptomatic cholelithiasis.   Past Surgical History  Appendectomy  Tonsillectomy   Diagnostic Studies History Colonoscopy  >10 years ago  Medication History Azithromycin (250MG  Tablet, Oral) Active. Doxycycline Hyclate (100MG  Capsule, Oral) Active. Ivermectin (3MG  Tablet, Oral) Active. Potassium Chloride ER (10MEQ Tablet ER, Oral) Active.  Social History  No alcohol use  No caffeine use  No drug use  Tobacco use  Never  smoker.  Family History  Family history unknown  First Degree Relatives   Other Problems  Gastric Ulcer  Gastroesophageal Reflux Disease   Review of Systems General Present- Appetite Loss, Fatigue and Weight Loss. Not Present- Chills, Fever, Night Sweats and Weight Gain. Skin Not Present- Change in Wart/Mole, Dryness, Hives, Jaundice, New Lesions, Non-Healing Wounds, Rash and Ulcer. HEENT Present- Ringing in the Ears. Not Present- Earache, Hearing Loss, Hoarseness, Nose Bleed, Oral Ulcers, Seasonal Allergies, Sinus Pain, Sore Throat, Visual Disturbances, Wears glasses/contact lenses and Yellow Eyes. Respiratory Not Present- Bloody sputum, Chronic Cough, Difficulty Breathing, Snoring and Wheezing. Breast Not Present- Breast Mass, Breast Pain, Nipple Discharge and Skin Changes. Cardiovascular Not Present- Chest Pain, Difficulty Breathing Lying Down, Leg Cramps, Palpitations, Rapid Heart Rate, Shortness of Breath and Swelling of Extremities. Gastrointestinal Present- Abdominal Pain, Indigestion, Nausea and Vomiting. Not Present- Bloating, Bloody Stool, Change in Bowel Habits, Chronic diarrhea, Constipation, Difficulty Swallowing, Excessive gas, Gets full quickly at meals, Hemorrhoids and Rectal Pain. Female Genitourinary Not Present- Frequency, Nocturia, Painful Urination, Pelvic Pain and Urgency. Musculoskeletal Present- Back Pain. Not Present- Joint Pain, Joint Stiffness, Muscle Pain, Muscle Weakness and Swelling of Extremities. Neurological Present- Decreased Memory, Headaches and Weakness. Not Present- Fainting, Numbness, Seizures, Tingling, Tremor and Trouble walking. Psychiatric Not Present- Anxiety, Bipolar, Change in Sleep Pattern, Depression, Fearful and Frequent crying. Endocrine Not Present- Cold Intolerance, Excessive Hunger, Hair Changes, Heat Intolerance, Hot flashes and New Diabetes. Hematology Not Present- Blood Thinners, Easy Bruising, Excessive bleeding, Gland problems, HIV  and Persistent Infections.  Vitals  Weight: 116.13 lb Temp.: 97.2F  Pulse: 106 (Regular)  P.OX: 99% (Room air) BP: 124/68(Sitting, Left Arm, Standard)  Physical Exam   GENERAL APPEARANCE Development: normal Nutritional status: normal Gross deformities: none  SKIN Rash,  lesions, ulcers: none Induration, erythema: none Nodules: none palpable  EYES Conjunctiva and lids: normal Pupils: equal and reactive Iris: normal bilaterally  EARS, NOSE, MOUTH, THROAT External ears: no lesion or deformity External nose: no lesion or deformity Hearing: grossly normal Due to Covid-19 pandemic, patient is wearing a mask.  NECK Symmetric: yes Trachea: midline Thyroid: no palpable nodules in the thyroid bed  CHEST Respiratory effort: normal Retraction or accessory muscle use: no Breath sounds: normal bilaterally Rales, rhonchi, wheeze: none  CARDIOVASCULAR Auscultation: regular rhythm, normal rate Murmurs: none Pulses: radial pulse 2+ palpable Lower extremity edema: none  ABDOMEN Distension: none Masses: none palpable Tenderness: none Hepatosplenomegaly: not present Hernia: not present Well-healed incision right lower quadrant consistent with appendectomy. Well-healed incision at base of umbilicus.  MUSCULOSKELETAL Station and gait: normal Digits and nails: no clubbing or cyanosis Muscle strength: grossly normal all extremities Range of motion: grossly normal all extremities Deformity: none  LYMPHATIC Cervical: none palpable Supraclavicular: none palpable  PSYCHIATRIC Oriented to person, place, and time: yes Mood and affect: normal for situation Judgment and insight: appropriate for situation    Assessment & Plan  CHOLELITHIASIS WITH CHRONIC CHOLECYSTITIS (K80.10)  Patient is referred by her primary care physician for surgical evaluation and management of chronic cholecystitis and cholelithiasis. Patient is provided with written literature on  gallbladder surgery to review at home. Patient is accompanied today by her son.  Ultrasound shows multiple gallstones. Most of her symptoms are consistent with intermittent biliary colic. I have recommended proceeding with laparoscopic cholecystectomy with intraoperative cholangiography. We discussed the risk and benefits of the surgery including the possibility of open surgery. We discussed performing cholangiography to evaluate the bile ducts. We discussed having an overnight stay following the procedure since she lives out of town. Patient understands and wishes to proceed with surgery in the near future due to continued ongoing symptoms.  The risks and benefits of the procedure have been discussed at length with the patient. The patient understands the proposed procedure, potential alternative treatments, and the course of recovery to be expected. All of the patient's questions have been answered at this time. The patient wishes to proceed with surgery.  Armandina Gemma, MD [facility] Office: [phone]

## 2020-08-20 ENCOUNTER — Other Ambulatory Visit: Payer: Self-pay

## 2020-08-20 ENCOUNTER — Encounter (HOSPITAL_COMMUNITY): Payer: Self-pay

## 2020-08-20 ENCOUNTER — Other Ambulatory Visit (HOSPITAL_COMMUNITY)
Admission: RE | Admit: 2020-08-20 | Discharge: 2020-08-20 | Disposition: A | Discharge status: Home / Self Care | Payer: Medicare Other | Source: Ambulatory Visit | Attending: Surgery | Admitting: Surgery

## 2020-08-20 ENCOUNTER — Encounter (HOSPITAL_COMMUNITY)
Admission: RE | Admit: 2020-08-20 | Discharge: 2020-08-20 | Disposition: A | Discharge status: Home / Self Care | Payer: Medicare Other | Source: Ambulatory Visit | Attending: Surgery | Admitting: Surgery

## 2020-08-20 DIAGNOSIS — Z01812 Encounter for preprocedural laboratory examination: Secondary | ICD-10-CM | POA: Diagnosis not present

## 2020-08-20 DIAGNOSIS — Z20822 Contact with and (suspected) exposure to covid-19: Secondary | ICD-10-CM | POA: Diagnosis not present

## 2020-08-20 HISTORY — DX: Headache, unspecified: R51.9

## 2020-08-20 HISTORY — DX: Diverticulosis of intestine, part unspecified, without perforation or abscess without bleeding: K57.90

## 2020-08-20 HISTORY — DX: Other complications of anesthesia, initial encounter: T88.59XA

## 2020-08-20 LAB — BASIC METABOLIC PANEL
Anion gap: 7 (ref 5–15)
BUN: 22 mg/dL (ref 8–23)
CO2: 31 mmol/L (ref 22–32)
Calcium: 9.5 mg/dL (ref 8.9–10.3)
Chloride: 103 mmol/L (ref 98–111)
Creatinine, Ser: 1.25 mg/dL — ABNORMAL HIGH (ref 0.44–1.00)
GFR, Estimated: 44 mL/min — ABNORMAL LOW (ref >60)
Glucose, Bld: 136 mg/dL — ABNORMAL HIGH (ref 70–99)
Potassium: 3.8 mmol/L (ref 3.5–5.1)
Sodium: 141 mmol/L (ref 135–145)

## 2020-08-20 LAB — CBC
HCT: 35.9 % — ABNORMAL LOW (ref 36.0–46.0)
Hemoglobin: 12.2 g/dL (ref 12.0–15.0)
MCH: 33.2 pg (ref 26.0–34.0)
MCHC: 34 g/dL (ref 30.0–36.0)
MCV: 97.6 fL (ref 80.0–100.0)
Platelets: 233 10*3/uL (ref 150–400)
RBC: 3.68 MIL/uL — ABNORMAL LOW (ref 3.87–5.11)
RDW: 12.8 % (ref 11.5–15.5)
WBC: 4.9 10*3/uL (ref 4.0–10.5)
nRBC: 0 % (ref 0.0–0.2)

## 2020-08-20 LAB — SARS CORONAVIRUS 2 (TAT 6-24 HRS): SARS Coronavirus 2: NEGATIVE

## 2020-08-22 ENCOUNTER — Ambulatory Visit (HOSPITAL_COMMUNITY)
Admission: RE | Admit: 2020-08-22 | Discharge: 2020-08-23 | Disposition: A | Discharge status: Home / Self Care | Payer: Medicare Other | Attending: Surgery | Admitting: Surgery

## 2020-08-22 ENCOUNTER — Ambulatory Visit (HOSPITAL_COMMUNITY): Payer: Medicare Other | Admitting: Registered Nurse

## 2020-08-22 ENCOUNTER — Encounter (HOSPITAL_COMMUNITY)
Admission: RE | Disposition: A | Discharge status: Home / Self Care | Payer: Self-pay | Source: Home / Self Care | Attending: Surgery

## 2020-08-22 ENCOUNTER — Encounter (HOSPITAL_COMMUNITY): Payer: Self-pay | Admitting: Surgery

## 2020-08-22 ENCOUNTER — Ambulatory Visit (HOSPITAL_COMMUNITY): Payer: Medicare Other | Admitting: Physician Assistant

## 2020-08-22 ENCOUNTER — Ambulatory Visit (HOSPITAL_COMMUNITY): Payer: Medicare Other

## 2020-08-22 DIAGNOSIS — Z881 Allergy status to other antibiotic agents status: Secondary | ICD-10-CM | POA: Diagnosis not present

## 2020-08-22 DIAGNOSIS — Z882 Allergy status to sulfonamides status: Secondary | ICD-10-CM | POA: Insufficient documentation

## 2020-08-22 DIAGNOSIS — Z888 Allergy status to other drugs, medicaments and biological substances status: Secondary | ICD-10-CM | POA: Insufficient documentation

## 2020-08-22 DIAGNOSIS — K8010 Calculus of gallbladder with chronic cholecystitis without obstruction: Secondary | ICD-10-CM | POA: Diagnosis present

## 2020-08-22 DIAGNOSIS — Z419 Encounter for procedure for purposes other than remedying health state, unspecified: Secondary | ICD-10-CM

## 2020-08-22 DIAGNOSIS — K838 Other specified diseases of biliary tract: Secondary | ICD-10-CM | POA: Diagnosis not present

## 2020-08-22 DIAGNOSIS — Z9049 Acquired absence of other specified parts of digestive tract: Secondary | ICD-10-CM | POA: Insufficient documentation

## 2020-08-22 HISTORY — PX: CHOLECYSTECTOMY: SHX55

## 2020-08-22 SURGERY — LAPAROSCOPIC CHOLECYSTECTOMY WITH INTRAOPERATIVE CHOLANGIOGRAM
Anesthesia: General | Site: Abdomen

## 2020-08-22 MED ORDER — HYDROMORPHONE HCL 1 MG/ML IJ SOLN: 0.2500 mg | INTRAMUSCULAR | Status: DC | PRN

## 2020-08-22 MED ORDER — ONDANSETRON HCL 4 MG/2ML IJ SOLN: 4.0000 mg | Freq: Once | INTRAMUSCULAR | Status: DC | PRN

## 2020-08-22 MED ORDER — TRAMADOL HCL 50 MG PO TABS: 50.0000 mg | ORAL_TABLET | Freq: Four times a day (QID) | ORAL | Status: DC | PRN

## 2020-08-22 MED ORDER — ACETAMINOPHEN 650 MG RE SUPP: 650.0000 mg | Freq: Four times a day (QID) | RECTAL | Status: DC | PRN

## 2020-08-22 MED ORDER — LIDOCAINE 2% (20 MG/ML) 5 ML SYRINGE
INTRAMUSCULAR | Status: DC | PRN
  Administered 2020-08-22: 50 mg via INTRAVENOUS

## 2020-08-22 MED ORDER — PROPOFOL 10 MG/ML IV BOLUS
INTRAVENOUS | Status: DC | PRN
  Administered 2020-08-22: 100 mg via INTRAVENOUS

## 2020-08-22 MED ORDER — MIDAZOLAM HCL 2 MG/2ML IJ SOLN
INTRAMUSCULAR | Status: AC
  Filled 2020-08-22: qty 2

## 2020-08-22 MED ORDER — ONDANSETRON HCL 4 MG/2ML IJ SOLN
INTRAMUSCULAR | Status: DC | PRN
  Administered 2020-08-22: 4 mg via INTRAVENOUS

## 2020-08-22 MED ORDER — SODIUM CHLORIDE 0.9 % IR SOLN
Status: DC | PRN
  Administered 2020-08-22: 1000 mL

## 2020-08-22 MED ORDER — SUGAMMADEX SODIUM 200 MG/2ML IV SOLN
INTRAVENOUS | Status: DC | PRN
  Administered 2020-08-22: 180 mg via INTRAVENOUS

## 2020-08-22 MED ORDER — CHLORHEXIDINE GLUCONATE CLOTH 2 % EX PADS: 6.0000 | MEDICATED_PAD | Freq: Once | CUTANEOUS | Status: DC

## 2020-08-22 MED ORDER — PROPOFOL 10 MG/ML IV BOLUS
INTRAVENOUS | Status: AC
  Filled 2020-08-22: qty 20

## 2020-08-22 MED ORDER — LIDOCAINE 2% (20 MG/ML) 5 ML SYRINGE
INTRAMUSCULAR | Status: AC
  Filled 2020-08-22: qty 5

## 2020-08-22 MED ORDER — FENTANYL CITRATE (PF) 100 MCG/2ML IJ SOLN
INTRAMUSCULAR | Status: DC | PRN
  Administered 2020-08-22 (×4): 25 ug via INTRAVENOUS

## 2020-08-22 MED ORDER — LACTATED RINGERS IV SOLN: INTRAVENOUS | Status: DC

## 2020-08-22 MED ORDER — DIPHENHYDRAMINE HCL 25 MG PO CAPS
25.0000 mg | ORAL_CAPSULE | Freq: Every evening | ORAL | Status: DC | PRN
  Administered 2020-08-23: 25 mg via ORAL
  Filled 2020-08-22: qty 1

## 2020-08-22 MED ORDER — ROCURONIUM BROMIDE 10 MG/ML (PF) SYRINGE
PREFILLED_SYRINGE | INTRAVENOUS | Status: AC
  Filled 2020-08-22: qty 10

## 2020-08-22 MED ORDER — HYDROMORPHONE HCL 1 MG/ML IJ SOLN: 1.0000 mg | INTRAMUSCULAR | Status: DC | PRN

## 2020-08-22 MED ORDER — FENTANYL CITRATE (PF) 100 MCG/2ML IJ SOLN
INTRAMUSCULAR | Status: AC
  Filled 2020-08-22: qty 2

## 2020-08-22 MED ORDER — DIPHENHYDRAMINE HCL 50 MG/ML IJ SOLN
INTRAMUSCULAR | Status: AC
  Filled 2020-08-22: qty 1

## 2020-08-22 MED ORDER — ONDANSETRON HCL 4 MG/2ML IJ SOLN
INTRAMUSCULAR | Status: AC
  Filled 2020-08-22: qty 2

## 2020-08-22 MED ORDER — 0.9 % SODIUM CHLORIDE (POUR BTL) OPTIME
TOPICAL | Status: DC | PRN
  Administered 2020-08-22: 1000 mL

## 2020-08-22 MED ORDER — DEXAMETHASONE SODIUM PHOSPHATE 10 MG/ML IJ SOLN
INTRAMUSCULAR | Status: DC | PRN
  Administered 2020-08-22: 5 mg via INTRAVENOUS

## 2020-08-22 MED ORDER — OXYCODONE HCL 5 MG PO TABS: 5.0000 mg | ORAL_TABLET | ORAL | Status: DC | PRN

## 2020-08-22 MED ORDER — ONDANSETRON 4 MG PO TBDP: 4.0000 mg | ORAL_TABLET | Freq: Four times a day (QID) | ORAL | Status: DC | PRN

## 2020-08-22 MED ORDER — AMISULPRIDE (ANTIEMETIC) 5 MG/2ML IV SOLN: 10.0000 mg | Freq: Once | INTRAVENOUS | Status: DC | PRN

## 2020-08-22 MED ORDER — EPHEDRINE 5 MG/ML INJ
INTRAVENOUS | Status: AC
  Filled 2020-08-22: qty 10

## 2020-08-22 MED ORDER — PHENYLEPHRINE 40 MCG/ML (10ML) SYRINGE FOR IV PUSH (FOR BLOOD PRESSURE SUPPORT)
PREFILLED_SYRINGE | INTRAVENOUS | Status: DC | PRN
  Administered 2020-08-22: 120 ug via INTRAVENOUS

## 2020-08-22 MED ORDER — ROCURONIUM BROMIDE 10 MG/ML (PF) SYRINGE
PREFILLED_SYRINGE | INTRAVENOUS | Status: DC | PRN
  Administered 2020-08-22: 50 mg via INTRAVENOUS

## 2020-08-22 MED ORDER — DIPHENHYDRAMINE HCL 50 MG/ML IJ SOLN
INTRAMUSCULAR | Status: DC | PRN
  Administered 2020-08-22: 12.5 mg via INTRAVENOUS

## 2020-08-22 MED ORDER — OXYCODONE HCL 5 MG PO TABS: 5.0000 mg | ORAL_TABLET | Freq: Once | ORAL | Status: DC | PRN

## 2020-08-22 MED ORDER — SODIUM CHLORIDE 0.45 % IV SOLN: INTRAVENOUS | Status: DC

## 2020-08-22 MED ORDER — ORAL CARE MOUTH RINSE: 15.0000 mL | Freq: Once | OROMUCOSAL | Status: AC

## 2020-08-22 MED ORDER — OXYCODONE HCL 5 MG/5ML PO SOLN: 5.0000 mg | Freq: Once | ORAL | Status: DC | PRN

## 2020-08-22 MED ORDER — ONDANSETRON HCL 4 MG/2ML IJ SOLN: 4.0000 mg | Freq: Four times a day (QID) | INTRAMUSCULAR | Status: DC | PRN

## 2020-08-22 MED ORDER — EPHEDRINE SULFATE 50 MG/ML IJ SOLN: INTRAMUSCULAR | Status: DC | PRN

## 2020-08-22 MED ORDER — EPHEDRINE SULFATE-NACL 50-0.9 MG/10ML-% IV SOSY
PREFILLED_SYRINGE | INTRAVENOUS | Status: DC | PRN
  Administered 2020-08-22 (×2): 5 mg via INTRAVENOUS
  Administered 2020-08-22: 10 mg via INTRAVENOUS

## 2020-08-22 MED ORDER — BUPIVACAINE-EPINEPHRINE (PF) 0.5% -1:200000 IJ SOLN
INTRAMUSCULAR | Status: AC
  Filled 2020-08-22: qty 30

## 2020-08-22 MED ORDER — SODIUM CHLORIDE 0.9 % IV SOLN
INTRAVENOUS | Status: DC | PRN
  Administered 2020-08-22: 11 mL

## 2020-08-22 MED ORDER — ACETAMINOPHEN 325 MG PO TABS: 650.0000 mg | ORAL_TABLET | Freq: Four times a day (QID) | ORAL | Status: DC | PRN

## 2020-08-22 MED ORDER — PHENYLEPHRINE 40 MCG/ML (10ML) SYRINGE FOR IV PUSH (FOR BLOOD PRESSURE SUPPORT)
PREFILLED_SYRINGE | INTRAVENOUS | Status: AC
  Filled 2020-08-22: qty 10

## 2020-08-22 MED ORDER — CHLORHEXIDINE GLUCONATE 0.12 % MT SOLN
15.0000 mL | Freq: Once | OROMUCOSAL | Status: AC
  Administered 2020-08-22: 15 mL via OROMUCOSAL

## 2020-08-22 MED ORDER — CEFAZOLIN SODIUM-DEXTROSE 2-4 GM/100ML-% IV SOLN
2.0000 g | INTRAVENOUS | Status: AC
  Administered 2020-08-22: 2 g via INTRAVENOUS
  Filled 2020-08-22: qty 100

## 2020-08-22 MED ORDER — ACETAMINOPHEN 500 MG PO TABS: 1000.0000 mg | ORAL_TABLET | Freq: Once | ORAL | Status: DC

## 2020-08-22 MED ORDER — BUPIVACAINE-EPINEPHRINE 0.5% -1:200000 IJ SOLN
INTRAMUSCULAR | Status: DC | PRN
Start: 2020-08-22 — End: 2020-08-22
  Administered 2020-08-22: 20 mL

## 2020-08-22 SURGICAL SUPPLY — 38 items
ADH SKN CLS APL DERMABOND .7 (GAUZE/BANDAGES/DRESSINGS)
APL PRP STRL LF DISP 70% ISPRP (MISCELLANEOUS) ×2
APPLIER CLIP ROT 10 11.4 M/L (STAPLE) ×2
APR CLP MED LRG 11.4X10 (STAPLE) ×1
BAG SPEC RTRVL LRG 6X4 10 (ENDOMECHANICALS) ×1
CABLE HIGH FREQUENCY MONO STRZ (ELECTRODE) ×2 IMPLANT
CHLORAPREP W/TINT 26 (MISCELLANEOUS) ×4 IMPLANT
CLIP APPLIE ROT 10 11.4 M/L (STAPLE) ×1 IMPLANT
COVER MAYO STAND STRL (DRAPES) ×2 IMPLANT
COVER SURGICAL LIGHT HANDLE (MISCELLANEOUS) ×2 IMPLANT
COVER WAND RF STERILE (DRAPES) IMPLANT
DECANTER SPIKE VIAL GLASS SM (MISCELLANEOUS) ×2 IMPLANT
DERMABOND ADVANCED (GAUZE/BANDAGES/DRESSINGS)
DERMABOND ADVANCED .7 DNX12 (GAUZE/BANDAGES/DRESSINGS) IMPLANT
DRAPE C-ARM 42X120 X-RAY (DRAPES) ×2 IMPLANT
ELECT REM PT RETURN 15FT ADLT (MISCELLANEOUS) ×2 IMPLANT
GAUZE SPONGE 2X2 8PLY STRL LF (GAUZE/BANDAGES/DRESSINGS) ×1 IMPLANT
GLOVE SURG ORTHO LTX SZ8 (GLOVE) ×2 IMPLANT
GOWN STRL REUS W/TWL XL LVL3 (GOWN DISPOSABLE) ×4 IMPLANT
HEMOSTAT SURGICEL 4X8 (HEMOSTASIS) IMPLANT
KIT BASIN OR (CUSTOM PROCEDURE TRAY) ×2 IMPLANT
KIT TURNOVER KIT A (KITS) ×2 IMPLANT
PENCIL SMOKE EVACUATOR (MISCELLANEOUS) IMPLANT
POUCH SPECIMEN RETRIEVAL 10MM (ENDOMECHANICALS) ×2 IMPLANT
SCISSORS LAP 5X35 DISP (ENDOMECHANICALS) ×2 IMPLANT
SET CHOLANGIOGRAPH MIX (MISCELLANEOUS) ×2 IMPLANT
SET IRRIG TUBING LAPAROSCOPIC (IRRIGATION / IRRIGATOR) ×2 IMPLANT
SET TUBE SMOKE EVAC HIGH FLOW (TUBING) IMPLANT
SLEEVE XCEL OPT CAN 5 100 (ENDOMECHANICALS) ×2 IMPLANT
SPONGE GAUZE 2X2 STER 10/PKG (GAUZE/BANDAGES/DRESSINGS) ×1
STRIP CLOSURE SKIN 1/2X4 (GAUZE/BANDAGES/DRESSINGS) IMPLANT
SUT MNCRL AB 4-0 PS2 18 (SUTURE) ×2 IMPLANT
TOWEL OR 17X26 10 PK STRL BLUE (TOWEL DISPOSABLE) ×2 IMPLANT
TOWEL OR NON WOVEN STRL DISP B (DISPOSABLE) ×2 IMPLANT
TRAY LAPAROSCOPIC (CUSTOM PROCEDURE TRAY) ×2 IMPLANT
TROCAR BLADELESS OPT 5 100 (ENDOMECHANICALS) ×2 IMPLANT
TROCAR XCEL BLUNT TIP 100MML (ENDOMECHANICALS) ×2 IMPLANT
TROCAR XCEL NON-BLD 11X100MML (ENDOMECHANICALS) ×2 IMPLANT

## 2020-08-22 NOTE — Op Note (Signed)
Procedure Note  Pre-operative Diagnosis:  Chronic cholecystitis, cholelithiasis  Post-operative Diagnosis:  same  Surgeon:  Armandina Gemma, MD  Assistant:  none   Procedure:  Laparoscopic cholecystectomy with intra-operative cholangiography  Anesthesia:  General  Estimated Blood Loss:  minimal  Drains: none         Specimen: gallbladder to pathology  Indications:  Patient is referred by Dr. Jerene Bears for surgical evaluation and management of symptomatic cholelithiasis and chronic cholecystitis. Patient has had intermittent symptoms since January 2022. She has had some severe episodes of right upper quadrant abdominal pain associated with nausea and vomiting. Patient had an ultrasound study performed on July 28, 2020. This showed multiple gallstones measuring up to 18 mm in diameter. There was no sign of acute infection or inflammation. There was a benign cyst in the liver and another benign cyst in the tail of the pancreas which were stable from prior studies. Patient denies any history of jaundice. She may have had fevers and chills. She has no history of hepatocellular disease or hepatitis. Previous abdominal surgery includes laparoscopy and appendectomy. Patient and her son who accompanies her today described multiple other symptoms including loss of consciousness, headaches, and possible strokelike symptoms. Apparently she was evaluated at [facility] in [place], [place], including imaging studies all of which were unrevealing. Patient is now referred by her primary care physician for cholecystectomy for symptomatic cholelithiasis.  Procedure Details:  The patient was seen in the pre-op holding area. The risks, benefits, complications, treatment options, and expected outcomes were previously discussed with the patient. The patient agreed with the proposed plan and has signed the informed consent form.  The patient was transported to operating room #4 at the [facility]. The patient was placed in the supine position on the operating room table. Following induction of general anesthesia, the abdomen was prepped and draped in the usual aseptic fashion.  An incision was made in the skin near the umbilicus. The midline fascia was incised and the peritoneal cavity was entered and a Hasson cannula was introduced under direct vision. The cannula was secured with a 0-Vicryl pursestring suture. Pneumoperitoneum was established with carbon dioxide. Additional cannulae were introduced under direct vision along the right costal margin in the midline, mid-clavicular line, and anterior axillary line.   The gallbladder was identified and the fundus grasped and retracted cephalad. Adhesions were taken down bluntly and the electrocautery was utilized as needed, taking care not to involve any adjacent structures. The infundibulum was grasped and retracted laterally, exposing the peritoneum overlying the triangle of Calot. The peritoneum was incised and structures exposed with blunt dissection. There was a prominent right hepatic artery present.  The cystic duct was clearly identified, bluntly dissected circumferentially, and clipped at the neck of the gallbladder.  An incision was made in the cystic duct and the cholangiogram catheter introduced. The catheter was secured using an ligaclip.  Real-time cholangiography was performed using C-arm fluoroscopy.  There was rapid filling of a normal caliber common bile duct.  There was reflux of contrast into the left and right hepatic ductal systems.  There was free flow distally into the duodenum without filling defect or obstruction.  The catheter was removed from the peritoneal cavity.  The cystic duct was then ligated with ligaclips and divided. The cystic artery was identified, dissected circumferentially away from the right hepatic artery, ligated with ligaclips, and divided.  The gallbladder was dissected away from the gallbladder  bed using the electrocautery for  hemostasis. The gallbladder was completely removed from the liver and placed into an endocatch bag. The gallbladder was removed in the endocatch bag through the umbilical port site and submitted to pathology for review.  The right upper quadrant was irrigated and the gallbladder bed was inspected. Hemostasis was achieved with the electrocautery.  Cannulae were removed under direct vision and good hemostasis was noted. Pneumoperitoneum was released and the majority of the carbon dioxide evacuated. The umbilical wound was irrigated and the fascia was then closed with the pursestring suture.  Local anesthetic was infiltrated at all port sites. Skin incisions were closed with 4-0 Monocril subcuticular sutures and Dermabond was applied.  Instrument, sponge, and needle counts were correct at the conclusion of the case.  The patient was awakened from anesthesia and brought to the recovery room in stable condition.  The patient tolerated the procedure well.   Armandina Gemma, MD [facility] Office: [phone]

## 2020-08-22 NOTE — Anesthesia Procedure Notes (Signed)
Procedure Name: Intubation Date/Time: 08/22/2020 11:18 AM Performed by: Victoriano Lain, CRNA Pre-anesthesia Checklist: Patient identified, Emergency Drugs available, Suction available, Patient being monitored and Timeout performed Patient Re-evaluated:Patient Re-evaluated prior to induction Oxygen Delivery Method: Circle system utilized Preoxygenation: Pre-oxygenation with 100% oxygen Induction Type: IV induction Ventilation: Mask ventilation without difficulty Laryngoscope Size: Mac and 4 Grade View: Grade I Tube type: Oral Tube size: 7.0 mm Number of attempts: 1 Airway Equipment and Method: Stylet Placement Confirmation: ETT inserted through vocal cords under direct vision,  positive ETCO2 and breath sounds checked- equal and bilateral Secured at: 21 cm Tube secured with: Tape Dental Injury: Teeth and Oropharynx as per pre-operative assessment

## 2020-08-22 NOTE — Transfer of Care (Signed)
Immediate Anesthesia Transfer of Care Note  Patient: Bridget Marquez  Procedure(s) Performed: LAPAROSCOPIC CHOLECYSTECTOMY WITH INTRAOPERATIVE CHOLANGIOGRAM (N/A Abdomen)  Patient Location: PACU  Anesthesia Type:General  Level of Consciousness: awake, alert , oriented and patient cooperative  Airway & Oxygen Therapy: Patient Spontanous Breathing and Patient connected to face mask oxygen  Post-op Assessment: Report given to RN, Post -op Vital signs reviewed and stable and Patient moving all extremities  Post vital signs: Reviewed and stable  Last Vitals:  Vitals Value Taken Time  BP 161/56 08/22/20 1231  Temp    Pulse 76 08/22/20 1233  Resp 15 08/22/20 1233  SpO2 100 % 08/22/20 1233  Vitals shown include unvalidated device data.  Last Pain:  Vitals:   08/22/20 0900  TempSrc:   PainSc: 0-No pain      Patients Stated Pain Goal: 3 (74/[id number])  Complications: No complications documented.

## 2020-08-22 NOTE — Interval H&P Note (Signed)
History and Physical Interval Note:  08/22/2020 10:36 AM  Bridget Marquez  has presented today for surgery, with the diagnosis of CHRONIC CHOLECYSTITIS.  The various methods of treatment have been discussed with the patient and family. After consideration of risks, benefits and other options for treatment, the patient has consented to    Procedure(s): LAPAROSCOPIC CHOLECYSTECTOMY WITH INTRAOPERATIVE CHOLANGIOGRAM (N/A) as a surgical intervention.    The patient's history has been reviewed, patient examined, no change in status, stable for surgery.  I have reviewed the patient's chart and labs.  Questions were answered to the patient's satisfaction.    Armandina Gemma, MD [facility] Office: [place]

## 2020-08-22 NOTE — Anesthesia Postprocedure Evaluation (Signed)
Anesthesia Post Note  Patient: [place]  Procedure(s) Performed: LAPAROSCOPIC CHOLECYSTECTOMY WITH INTRAOPERATIVE CHOLANGIOGRAM (N/A Abdomen)     Patient location during evaluation: PACU Anesthesia Type: General Level of consciousness: awake and alert, oriented and patient cooperative Pain management: pain level controlled Vital Signs Assessment: post-procedure vital signs reviewed and stable Respiratory status: spontaneous breathing, nonlabored ventilation and respiratory function stable Cardiovascular status: blood pressure returned to baseline and stable Postop Assessment: no apparent nausea or vomiting Anesthetic complications: no   No complications documented.  Last Vitals:  Vitals:   08/22/20 1230 08/22/20 1245  BP: (!) 161/56 (!) 161/62  Pulse: 76 73  Resp: 18 13  Temp: (!) 36.4 C   SpO2: 100% 98%    Last Pain:  Vitals:   08/22/20 1245  TempSrc:   PainSc: 0-No pain                 Pervis Hocking

## 2020-08-22 NOTE — Anesthesia Preprocedure Evaluation (Addendum)
Anesthesia Evaluation  Patient identified by MRN, date of birth, ID band Patient awake    Reviewed: Allergy & Precautions, NPO status , Patient's Chart, lab work & pertinent test results  History of Anesthesia Complications (+) PROLONGED EMERGENCE and history of anesthetic complications (pt unsure what happened in past, thinks she had memory problems with GA when she was younger)  Airway Mallampati: I  TM Distance: >3 FB Neck ROM: Full    Dental no notable dental hx. (+) Teeth Intact, Dental Advisory Given   Pulmonary neg pulmonary ROS,    Pulmonary exam normal breath sounds clear to auscultation       Cardiovascular negative cardio ROS Normal cardiovascular exam Rhythm:Regular Rate:Normal     Neuro/Psych  Headaches, negative psych ROS   GI/Hepatic Neg liver ROS, CHRONIC CHOLECYSTITIS   Endo/Other  negative endocrine ROS  Renal/GU Cr 1.25  negative genitourinary   Musculoskeletal negative musculoskeletal ROS (+)   Abdominal   Peds  Hematology negative hematology ROS (+) hct 35.9   Anesthesia Other Findings Day of surgery medications reviewed with patient.  Reproductive/Obstetrics negative OB ROS                           Anesthesia Physical Anesthesia Plan  ASA: II  Anesthesia Plan: General   Post-op Pain Management:    Induction: Intravenous  PONV Risk Score and Plan: 3 and Ondansetron, Dexamethasone and Treatment may vary due to age or medical condition  Airway Management Planned: Oral ETT  Additional Equipment: None  Intra-op Plan:   Post-operative Plan: Extubation in OR  Informed Consent: I have reviewed the patients History and Physical, chart, labs and discussed the procedure including the risks, benefits and alternatives for the proposed anesthesia with the patient or authorized representative who has indicated his/her understanding and acceptance.     Dental  advisory given  Plan Discussed with: CRNA  Anesthesia Plan Comments: (Per pt, has been diagnosed with alpha-gal after a tick bite- not sure if she has labs done or not, has no record of this with her.)      Anesthesia Quick Evaluation

## 2020-08-23 ENCOUNTER — Encounter (HOSPITAL_COMMUNITY): Payer: Self-pay | Admitting: Surgery

## 2020-08-23 DIAGNOSIS — Z888 Allergy status to other drugs, medicaments and biological substances status: Secondary | ICD-10-CM | POA: Diagnosis not present

## 2020-08-23 DIAGNOSIS — K8010 Calculus of gallbladder with chronic cholecystitis without obstruction: Secondary | ICD-10-CM | POA: Diagnosis not present

## 2020-08-23 DIAGNOSIS — Z881 Allergy status to other antibiotic agents status: Secondary | ICD-10-CM | POA: Diagnosis not present

## 2020-08-23 DIAGNOSIS — Z9049 Acquired absence of other specified parts of digestive tract: Secondary | ICD-10-CM | POA: Diagnosis not present

## 2020-08-23 DIAGNOSIS — Z882 Allergy status to sulfonamides status: Secondary | ICD-10-CM | POA: Diagnosis not present

## 2020-08-23 LAB — SURGICAL PATHOLOGY

## 2020-08-23 MED ORDER — TRAMADOL HCL 50 MG PO TABS: 50.0000 mg | ORAL_TABLET | Freq: Four times a day (QID) | ORAL | 0 refills | Status: DC | PRN

## 2020-08-23 NOTE — Discharge Instructions (Signed)
[facility]  LAPAROSCOPIC SURGERY:  POST-OP INSTRUCTIONS  Always review your discharge instruction sheet given to you by the facility where your surgery was performed.  A prescription for pain medication may be given to you upon discharge.  Take your pain medication as prescribed.  If narcotic pain medicine is not needed, then you may take acetaminophen (Tylenol) or ibuprofen (Advil) as needed.  Take your usually prescribed medications unless otherwise directed.  If you need a refill on your pain medication, please contact your pharmacy.  They will contact our office to request authorization. Prescriptions will not be filled after 5 P.M. or on weekends.  You should follow a light diet the first few days after arrival home, such as soup and crackers or toast.  Be sure to include plenty of fluids daily.  Most patients will experience some swelling and bruising in the area of the incisions.  Ice packs will help.  Swelling and bruising can take several days to resolve.   It is common to experience some constipation after surgery.  Increasing fluid intake and taking a stool softener (such as Colace) will usually help or prevent this problem from occurring.  A mild laxative (Milk of Magnesia or Miralax) should be taken according to package instructions if there has been no bowel movement after 48 hours.  You will likely have Dermabond (topical glue) over your incisions.  This seals the incisions and allows you to bathe and shower at any time after your surgery.  Glue should remain in place for up to 10 days.  It may be removed after 10 days by pealing off the Dermabond material or using Vaseline or naval jelly to remove.  If you have steri-strips over your incisions, you may remove the gauze bandage on the second day after surgery, and you may shower at that time.  Leave your steri-strips (small skin tapes) in place directly over the incision.  These strips should remain on the  skin for 5-7 days and then be removed.  You may get them wet in the shower and pat them dry.  Any sutures or staples will be removed at the office during your follow-up visit.  ACTIVITIES:  You may resume regular (light) daily activities beginning the next day - such as daily self-care, walking, climbing stairs - gradually increasing activities as tolerated.  You may have sexual intercourse when it is comfortable.  Refrain from any heavy lifting or straining until approved by your doctor.  You may drive when you are no longer taking prescription pain medication, when you can comfortably wear a seatbelt, and when you can safely maneuver your car and apply brakes.  You should see your doctor in the office for a follow-up appointment approximately 2-3 weeks after your surgery.  Make sure that you call for this appointment within a day or two after you arrive home to insure a convenient appointment time.  WHEN TO CALL YOUR DOCTOR: 1. Fever over 101.0 2. Inability to urinate 3. Continued bleeding from incision 4. Increased pain, redness, or drainage from the incision 5. Increasing abdominal pain  The clinic staff is available to answer your questions during regular business hours.  Please don't hesitate to call and ask to speak to one of the nurses for clinical concerns.  If you have a medical emergency, go to the nearest emergency room or call 911.  A surgeon from [facility] is always on call for the hospital.  Earnstine Regal, MD, [facility]  [facility] Office: [place] Free:  [place] [phone]  Website: [email or web address]

## 2020-08-23 NOTE — Discharge Summary (Signed)
Physician Discharge Summary [facility]  Patient ID: Bridget Marquez MRN: [id number] DOB/AGE: 1941/01/02 80 y.o.  Admit date: 08/22/2020  Discharge date: 08/23/2020  Discharge Diagnoses:  Principal Problem:   Cholelithiasis with chronic cholecystitis   Discharged Condition: good  Hospital Course: Patient was admitted for observation following gallbladder surgery.  Post op course was uncomplicated.  Pain was well controlled.  Tolerated diet.  Patient was prepared for discharge home on POD#1.  Consults: None  Treatments: surgery: lap chole with IOC  Discharge Exam: Blood pressure (!) 120/54, pulse 60, temperature 98.4 F (36.9 C), temperature source Oral, resp. rate 17, height 5\' 4"  (1.626 m), weight 52.6 kg, SpO2 99 %. HEENT - clear Neck - soft Chest - clear bilaterally Cor - RRR Abd - wounds dry and intact with Dermabond in place; mild ecchymosis at umbilical wound  Disposition: Home  Discharge Instructions    Diet - low sodium heart healthy   Complete by: As directed    Discharge instructions   Complete by: As directed    [facility]  LAPAROSCOPIC SURGERY:  POST-OP INSTRUCTIONS  Always review your discharge instruction sheet given to you by the facility where your surgery was performed.  A prescription for pain medication may be given to you upon discharge.  Take your pain medication as prescribed.  If narcotic pain medicine is not needed, then you may take acetaminophen (Tylenol) or ibuprofen (Advil) as needed.  Take your usually prescribed medications unless otherwise directed.  If you need a refill on your pain medication, please contact your pharmacy.  They will contact our office to request authorization. Prescriptions will not be filled after 5 P.M. or on weekends.  You should follow a light diet the first few days after arrival home, such as soup and crackers or toast.  Be sure to include plenty of fluids daily.  Most  patients will experience some swelling and bruising in the area of the incisions.  Ice packs will help.  Swelling and bruising can take several days to resolve.   It is common to experience some constipation after surgery.  Increasing fluid intake and taking a stool softener (such as Colace) will usually help or prevent this problem from occurring.  A mild laxative (Milk of Magnesia or Miralax) should be taken according to package instructions if there has been no bowel movement after 48 hours.  You will likely have Dermabond (topical glue) over your incisions.  This seals the incisions and allows you to bathe and shower at any time after your surgery.  Glue should remain in place for up to 10 days.  It may be removed after 10 days by pealing off the Dermabond material or using Vaseline or naval jelly to remove.  If you have steri-strips over your incisions, you may remove the gauze bandage on the second day after surgery, and you may shower at that time.  Leave your steri-strips (small skin tapes) in place directly over the incision.  These strips should remain on the skin for 5-7 days and then be removed.  You may get them wet in the shower and pat them dry.  Any sutures or staples will be removed at the office during your follow-up visit.  ACTIVITIES:  You may resume regular (light) daily activities beginning the next day - such as daily self-care, walking, climbing stairs - gradually increasing activities as tolerated.  You may have sexual intercourse when it is comfortable.  Refrain from  any heavy lifting or straining until approved by your doctor.  You may drive when you are no longer taking prescription pain medication, when you can comfortably wear a seatbelt, and when you can safely maneuver your car and apply brakes.  You should see your doctor in the office for a follow-up appointment approximately 2-3 weeks after your surgery.  Make sure that you call for this appointment within a day or  two after you arrive home to insure a convenient appointment time.  WHEN TO CALL YOUR DOCTOR: Fever over 101.0 Inability to urinate Continued bleeding from incision Increased pain, redness, or drainage from the incision Increasing abdominal pain  The clinic staff is available to answer your questions during regular business hours.  Please don't hesitate to call and ask to speak to one of the nurses for clinical concerns.  If you have a medical emergency, go to the nearest emergency room or call 911.  A surgeon from [facility] is always on call for the hospital.  Earnstine Regal, MD, [facility] Office: [place] Free:  [place] [phone]  Website: [email or web address]   Increase activity slowly   Complete by: As directed    No dressing needed   Complete by: As directed      Allergies as of 08/23/2020      Reactions   Bactrim [sulfamethoxazole-trimethoprim]    Ciprofloxacin    Ringing in hears   Doxycycline Hyclate Nausea And Vomiting   Prednisone Rash   Injection   Zithromax [azithromycin] Rash   Pt just recently took a zpack with no issues      Medication List    TAKE these medications   acidophilus Caps capsule Take 1 capsule by mouth daily.   cholecalciferol 25 MCG (1000 UNIT) tablet Commonly known as: VITAMIN D3 Take 1,000 Units by mouth daily.   Krill Oil 350 MG Caps Take 350 mg by mouth daily.   Magnesium 400 MG Caps Take by mouth.   multivitamin with minerals Tabs tablet Take 1 tablet by mouth daily.   POMEGRANATE PO Take 1 capsule by mouth daily.   traMADol 50 MG tablet Commonly known as: ULTRAM Take 1-2 tablets (50-100 mg total) by mouth every 6 (six) hours as needed for moderate pain.   vitamin k 100 MCG tablet Take 100 mcg by mouth daily.   zinc gluconate 50 MG tablet Take 50 mg by mouth daily.            Discharge Care Instructions  (From admission, onward)          Start     Ordered   08/23/20 0000  No dressing needed        08/23/20 5427          Follow-up Information    Armandina Gemma, MD. Schedule an appointment as soon as possible for a visit in 3 week(s).   Specialty: General Surgery Contact information: [address] [place] [address] [phone]               Armandina Gemma, [facility] Office: [phone]   Signed: Armandina Gemma 08/23/2020, 9:13 AM

## 2020-12-31 DIAGNOSIS — Z0000 Encounter for general adult medical examination without abnormal findings: Secondary | ICD-10-CM | POA: Diagnosis not present

## 2020-12-31 DIAGNOSIS — Z79899 Other long term (current) drug therapy: Secondary | ICD-10-CM | POA: Diagnosis not present

## 2020-12-31 DIAGNOSIS — Z7189 Other specified counseling: Secondary | ICD-10-CM | POA: Diagnosis not present

## 2020-12-31 DIAGNOSIS — Z1331 Encounter for screening for depression: Secondary | ICD-10-CM | POA: Diagnosis not present

## 2020-12-31 DIAGNOSIS — Z1339 Encounter for screening examination for other mental health and behavioral disorders: Secondary | ICD-10-CM | POA: Diagnosis not present

## 2020-12-31 DIAGNOSIS — R5383 Other fatigue: Secondary | ICD-10-CM | POA: Diagnosis not present

## 2020-12-31 DIAGNOSIS — E7800 Pure hypercholesterolemia, unspecified: Secondary | ICD-10-CM | POA: Diagnosis not present

## 2020-12-31 DIAGNOSIS — Z6821 Body mass index (BMI) 21.0-21.9, adult: Secondary | ICD-10-CM | POA: Diagnosis not present

## 2020-12-31 DIAGNOSIS — Z299 Encounter for prophylactic measures, unspecified: Secondary | ICD-10-CM | POA: Diagnosis not present

## 2021-01-01 DIAGNOSIS — R5383 Other fatigue: Secondary | ICD-10-CM | POA: Diagnosis not present

## 2021-01-01 DIAGNOSIS — E7800 Pure hypercholesterolemia, unspecified: Secondary | ICD-10-CM | POA: Diagnosis not present

## 2021-01-01 DIAGNOSIS — Z79899 Other long term (current) drug therapy: Secondary | ICD-10-CM | POA: Diagnosis not present

## 2021-08-10 ENCOUNTER — Encounter (HOSPITAL_COMMUNITY): Payer: Self-pay | Admitting: Emergency Medicine

## 2021-08-10 ENCOUNTER — Emergency Department (HOSPITAL_COMMUNITY)
Admission: EM | Admit: 2021-08-10 | Discharge: 2021-08-10 | Disposition: A | Discharge status: Home / Self Care | Payer: Medicare Other | Attending: Emergency Medicine | Admitting: Emergency Medicine

## 2021-08-10 ENCOUNTER — Other Ambulatory Visit: Payer: Self-pay

## 2021-08-10 ENCOUNTER — Emergency Department (HOSPITAL_COMMUNITY): Payer: Medicare Other

## 2021-08-10 DIAGNOSIS — R531 Weakness: Secondary | ICD-10-CM | POA: Diagnosis not present

## 2021-08-10 DIAGNOSIS — R059 Cough, unspecified: Secondary | ICD-10-CM | POA: Diagnosis not present

## 2021-08-10 DIAGNOSIS — R918 Other nonspecific abnormal finding of lung field: Secondary | ICD-10-CM | POA: Diagnosis not present

## 2021-08-10 DIAGNOSIS — R0602 Shortness of breath: Secondary | ICD-10-CM | POA: Diagnosis not present

## 2021-08-10 DIAGNOSIS — R42 Dizziness and giddiness: Secondary | ICD-10-CM | POA: Diagnosis not present

## 2021-08-10 DIAGNOSIS — R55 Syncope and collapse: Secondary | ICD-10-CM | POA: Diagnosis not present

## 2021-08-10 DIAGNOSIS — I700 Atherosclerosis of aorta: Secondary | ICD-10-CM | POA: Diagnosis not present

## 2021-08-10 DIAGNOSIS — J479 Bronchiectasis, uncomplicated: Secondary | ICD-10-CM | POA: Diagnosis not present

## 2021-08-10 HISTORY — DX: Allergy to other foods: Z91.018

## 2021-08-10 LAB — HEPATIC FUNCTION PANEL
ALT: 17 U/L (ref 0–44)
AST: 28 U/L (ref 15–41)
Albumin: 4.3 g/dL (ref 3.5–5.0)
Alkaline Phosphatase: 70 U/L (ref 38–126)
Bilirubin, Direct: <0.1 mg/dL (ref 0.0–0.2)
Total Bilirubin: 0.8 mg/dL (ref 0.3–1.2)
Total Protein: 7.1 g/dL (ref 6.5–8.1)

## 2021-08-10 LAB — CBC
HCT: 39.7 % (ref 36.0–46.0)
Hemoglobin: 13.2 g/dL (ref 12.0–15.0)
MCH: 32.7 pg (ref 26.0–34.0)
MCHC: 33.2 g/dL (ref 30.0–36.0)
MCV: 98.3 fL (ref 80.0–100.0)
Platelets: 229 10*3/uL (ref 150–400)
RBC: 4.04 MIL/uL (ref 3.87–5.11)
RDW: 12.3 % (ref 11.5–15.5)
WBC: 6.6 10*3/uL (ref 4.0–10.5)
nRBC: 0 % (ref 0.0–0.2)

## 2021-08-10 LAB — TROPONIN I (HIGH SENSITIVITY)
Troponin I (High Sensitivity): 4 ng/L (ref <18)
Troponin I (High Sensitivity): 10 ng/L (ref <18)

## 2021-08-10 LAB — URINALYSIS, ROUTINE W REFLEX MICROSCOPIC
Bilirubin Urine: NEGATIVE
Glucose, UA: NEGATIVE mg/dL
Hgb urine dipstick: NEGATIVE
Ketones, ur: NEGATIVE mg/dL
Leukocytes,Ua: NEGATIVE
Nitrite: NEGATIVE
Protein, ur: NEGATIVE mg/dL
Specific Gravity, Urine: 1.008 (ref 1.005–1.030)
pH: 8 (ref 5.0–8.0)

## 2021-08-10 LAB — D-DIMER, QUANTITATIVE: D-Dimer, Quant: 0.68 ug/mL-FEU — ABNORMAL HIGH (ref 0.00–0.50)

## 2021-08-10 LAB — BASIC METABOLIC PANEL
Anion gap: 11 (ref 5–15)
BUN: 23 mg/dL (ref 8–23)
CO2: 25 mmol/L (ref 22–32)
Calcium: 9.8 mg/dL (ref 8.9–10.3)
Chloride: 101 mmol/L (ref 98–111)
Creatinine, Ser: 0.94 mg/dL (ref 0.44–1.00)
GFR, Estimated: >60 mL/min (ref >60)
Glucose, Bld: 100 mg/dL — ABNORMAL HIGH (ref 70–99)
Potassium: 3.6 mmol/L (ref 3.5–5.1)
Sodium: 137 mmol/L (ref 135–145)

## 2021-08-10 MED ORDER — IOHEXOL 350 MG/ML SOLN
75.0000 mL | Freq: Once | INTRAVENOUS | Status: AC | PRN
  Administered 2021-08-10: 75 mL via INTRAVENOUS

## 2021-08-10 MED ORDER — SODIUM CHLORIDE 0.9 % IV BOLUS
500.0000 mL | Freq: Once | INTRAVENOUS | Status: AC
  Administered 2021-08-10: 500 mL via INTRAVENOUS

## 2021-08-10 MED ORDER — AMOXICILLIN-POT CLAVULANATE 875-125 MG PO TABS: 1.0000 | ORAL_TABLET | Freq: Two times a day (BID) | ORAL | 0 refills | Status: DC

## 2021-08-10 NOTE — Discharge Instructions (Signed)
Follow-up with your family doctor this week and return if any problem ?

## 2021-08-10 NOTE — ED Provider Notes (Signed)
?[facility] ?Provider Note ? ? ?CSN: [id number] ?Arrival date & time: 08/10/21  1157 ? ?  ? ?History ? ?Chief Complaint  ?Patient presents with  ? Near Syncope  ? ? ?Bridget Marquez is a 81 y.o. female. ? ?Patient complains of shortness of breath and weakness with a cough.  No medical problems ? ?The history is provided by the nursing home and medical records. No language interpreter was used.  ?Cough ?Cough characteristics:  Non-productive ?Sputum characteristics:  Nondescript ?Severity:  Mild ?Onset quality:  Sudden ?Timing:  Intermittent ?Progression:  Waxing and waning ?Chronicity:  New ?Smoker: no   ?Context: not animal exposure   ?Relieved by:  Nothing ?Associated symptoms: no chest pain, no eye discharge, no headaches and no rash   ? ?  ? ?Home Medications ?Prior to Admission medications   ?Medication Sig Start Date End Date Taking? Authorizing Provider  ?amoxicillin-clavulanate (AUGMENTIN) 875-125 MG tablet Take 1 tablet by mouth 2 (two) times daily. One po bid x 7 days 08/10/21  Yes Milton Ferguson, MD  ?acidophilus (RISAQUAD) CAPS capsule Take 1 capsule by mouth daily.    [provider]  ?cholecalciferol (VITAMIN D3) 25 MCG (1000 UNIT) tablet Take 1,000 Units by mouth daily.    [provider]  ?Astrid Drafts 350 MG CAPS Take 350 mg by mouth daily.    [provider]  ?Magnesium 400 MG CAPS Take by mouth.    [provider]  ?Multiple Vitamin (MULTIVITAMIN WITH MINERALS) TABS tablet Take 1 tablet by mouth daily.    [provider]  ?Pomegranate, Punica granatum, (POMEGRANATE PO) Take 1 capsule by mouth daily.    [provider]  ?traMADol (ULTRAM) 50 MG tablet Take 1-2 tablets (50-100 mg total) by mouth every 6 (six) hours as needed for moderate pain. 08/23/20   Armandina Gemma, MD  ?vitamin k 100 MCG tablet Take 100 mcg by mouth daily.    [provider]  ?zinc gluconate 50 MG tablet Take 50 mg by mouth daily.    [provider]  ?   ? ?Allergies    ?Bactrim [sulfamethoxazole-trimethoprim], Beef-derived products, Ciprofloxacin, Doxycycline hyclate, Tetanus toxoids, Prednisone, and Zithromax [azithromycin]   ? ?Review of Systems   ?Review of Systems  ?Constitutional:  Negative for appetite change and fatigue.  ?HENT:  Negative for congestion, ear discharge and sinus pressure.   ?Eyes:  Negative for discharge.  ?Respiratory:  Positive for cough.   ?Cardiovascular:  Positive for near-syncope. Negative for chest pain.  ?Gastrointestinal:  Negative for abdominal pain and diarrhea.  ?Genitourinary:  Negative for frequency and hematuria.  ?Musculoskeletal:  Negative for back pain.  ?Skin:  Negative for rash.  ?Neurological:  Negative for seizures and headaches.  ?Psychiatric/Behavioral:  Negative for hallucinations.   ? ?Physical Exam ?Updated Vital Signs ?BP (!) 141/70   Pulse 66   Temp 98.4 ?F (36.9 ?C) (Oral)   Resp 17   Ht '5\' 4"'$  (1.626 m)   Wt 55.3 kg   SpO2 100%   BMI 20.94 kg/m?  ?Physical Exam ?Vitals and nursing note reviewed.  ?Constitutional:   ?   Appearance: She is well-developed.  ?HENT:  ?   Head: Normocephalic.  ?   Nose: Nose normal.  ?Eyes:  ?   General: No scleral icterus. ?   Conjunctiva/sclera: Conjunctivae normal.  ?Neck:  ?   Thyroid: No thyromegaly.  ?Cardiovascular:  ?   Rate and Rhythm: Normal rate and regular rhythm.  ?  Heart sounds: No murmur heard. ?  No friction rub. No gallop.  ?Pulmonary:  ?   Breath sounds: No stridor. No wheezing or rales.  ?Chest:  ?   Chest wall: No tenderness.  ?Abdominal:  ?   General: There is no distension.  ?   Tenderness: There is no abdominal tenderness. There is no rebound.  ?Musculoskeletal:     ?   General: Normal range of motion.  ?   Cervical back: Neck supple.  ?Lymphadenopathy:  ?   Cervical: No cervical adenopathy.  ?Skin: ?   Findings: No erythema or rash.  ?Neurological:  ?   Mental Status: She is oriented to person, place, and time.  ?   Motor: No abnormal muscle  tone.  ?   Coordination: Coordination normal.  ?Psychiatric:     ?   Behavior: Behavior normal.  ? ? ?ED Results / Procedures / Treatments   ?Labs ?(all labs ordered are listed, but only abnormal results are displayed) ?Labs Reviewed  ?BASIC METABOLIC PANEL - Abnormal; Notable for the following components:  ?    Result Value  ? Glucose, Bld 100 (*)   ? All other components within normal limits  ?D-DIMER, QUANTITATIVE - Abnormal; Notable for the following components:  ? D-Dimer, Quant 0.68 (*)   ? All other components within normal limits  ?CBC  ?URINALYSIS, ROUTINE W REFLEX MICROSCOPIC  ?HEPATIC FUNCTION PANEL  ?TROPONIN I (HIGH SENSITIVITY)  ?TROPONIN I (HIGH SENSITIVITY)  ? ? ?EKG ?None ? ?Radiology ?CT Angio Chest PE W and/or Wo Contrast ? ?Result Date: 08/10/2021 ?CLINICAL DATA:  Near syncopal episode. EXAM: CT ANGIOGRAPHY CHEST WITH CONTRAST TECHNIQUE: Multidetector CT imaging of the chest was performed using the standard protocol during bolus administration of intravenous contrast. Multiplanar CT image reconstructions and MIPs were obtained to evaluate the vascular anatomy. RADIATION DOSE REDUCTION: This exam was performed according to the departmental dose-optimization program which includes automated exposure control, adjustment of the mA and/or kV according to patient size and/or use of iterative reconstruction technique. CONTRAST:  38m OMNIPAQUE IOHEXOL 350 MG/ML SOLN COMPARISON:  Current chest radiograph. Prior CTA of the chest, abdomen and pelvis, 07/22/2018. FINDINGS: Cardiovascular: Pulmonary arteries are well opacified. There is no evidence of a pulmonary embolism. Heart is normal in size and configuration. No pericardial effusion. Great vessels are normal in caliber. Stable aortic atherosclerosis. No significant stenosis. Mediastinum/Nodes: No neck base, mediastinal or hilar masses or enlarged lymph nodes. Trachea and esophagus are unremarkable. Lungs/Pleura: Focal opacity in the right upper lobe,  centered on image 52, series 6, which appears inflammatory, new since the prior CT. Focal opacity with mild associated bronchiectasis in the medial right middle lobe consistent with scarring, similar to the prior CT. Small pleural based nodular opacity, left lower lobe, image 118, series 6, stable from 2020 exam, benign. Few minor areas of additional reticular opacities noted in the anterior lung bases consistent with scarring. Stable apical pleuroparenchymal scarring. Remainder of the lungs is clear. No pleural effusion or pneumothorax. Upper Abdomen: No acute abnormality. Musculoskeletal: No fracture or acute finding.  No bone lesion. Review of the MIP images confirms the above findings. IMPRESSION: 1. No evidence of a pulmonary embolism. 2. Small area of focal opacity in the right upper lobe consistent with infection, new since the prior exam. No other evidence of an acute abnormality. Aortic Atherosclerosis (ICD10-I70.0). Electronically Signed   By: DLajean ManesM.D.   On: 08/10/2021 14:47  ? ?DG Chest Port 1 View ? ?  Result Date: 08/10/2021 ?CLINICAL DATA:  Weakness, dizziness EXAM: PORTABLE CHEST 1 VIEW COMPARISON:  Chest radiograph 07/28/2020 FINDINGS: The cardiomediastinal silhouette is normal. There is no focal consolidation or pulmonary edema. There is no pleural effusion or pneumothorax There is no acute osseous abnormality. IMPRESSION: No radiographic evidence of acute cardiopulmonary process. Electronically Signed   By: Valetta Mole M.D.   On: 08/10/2021 13:02   ? ?Procedures ?Procedures  ? ? ?Medications Ordered in ED ?Medications  ?sodium chloride 0.9 % bolus 500 mL (0 mLs Intravenous Stopped 08/10/21 1317)  ?iohexol (OMNIPAQUE) 350 MG/ML injection 75 mL (75 mLs Intravenous Contrast Given 08/10/21 1414)  ? ? ?ED Course/ Medical Decision Making/ A&P ?  ?                        ?Medical Decision Making ?Amount and/or Complexity of Data Reviewed ?Labs: ordered. ?Radiology: ordered. ? ?Risk ?Prescription  drug management. ? ?This patient presents to the ED for concern of cough, this involves an extensive number of treatment options, and is a complaint that carries with it a high risk of complications and morbidity.

## 2021-08-10 NOTE — ED Triage Notes (Signed)
Patient now states she did have some left side chest pain and shortness of breath at the time. Denies any cardiac hx. Patient reports taking 2 aspirin and pain has subsided.  ?

## 2021-08-10 NOTE — ED Triage Notes (Addendum)
Patient c/o episodes of "feeling like she was going to pass out." Per patient had an episode of dizziness with bilateral leg pain that started on Tuesday but was brief. Patient states started again this morning again but had generalized weakness with the feeling of "about to pass out." Patient unsure of how long episode lasted but states she still has generalized weakness and is now having tremors all over.  ?

## 2021-08-12 DIAGNOSIS — I700 Atherosclerosis of aorta: Secondary | ICD-10-CM | POA: Diagnosis not present

## 2021-08-12 DIAGNOSIS — I779 Disorder of arteries and arterioles, unspecified: Secondary | ICD-10-CM | POA: Diagnosis not present

## 2021-08-12 DIAGNOSIS — Z299 Encounter for prophylactic measures, unspecified: Secondary | ICD-10-CM | POA: Diagnosis not present

## 2021-08-12 DIAGNOSIS — E7220 Disorder of urea cycle metabolism, unspecified: Secondary | ICD-10-CM | POA: Diagnosis not present

## 2021-08-12 DIAGNOSIS — J189 Pneumonia, unspecified organism: Secondary | ICD-10-CM | POA: Diagnosis not present

## 2021-08-26 ENCOUNTER — Encounter (HOSPITAL_COMMUNITY): Payer: Self-pay | Admitting: Emergency Medicine

## 2021-08-26 ENCOUNTER — Emergency Department (HOSPITAL_COMMUNITY): Payer: Medicare Other

## 2021-08-26 ENCOUNTER — Other Ambulatory Visit: Payer: Self-pay

## 2021-08-26 ENCOUNTER — Emergency Department (HOSPITAL_COMMUNITY)
Admission: EM | Admit: 2021-08-26 | Discharge: 2021-08-26 | Disposition: A | Discharge status: Home / Self Care | Payer: Medicare Other | Attending: Emergency Medicine | Admitting: Emergency Medicine

## 2021-08-26 DIAGNOSIS — W010XXA Fall on same level from slipping, tripping and stumbling without subsequent striking against object, initial encounter: Secondary | ICD-10-CM | POA: Insufficient documentation

## 2021-08-26 DIAGNOSIS — S52502A Unspecified fracture of the lower end of left radius, initial encounter for closed fracture: Secondary | ICD-10-CM | POA: Insufficient documentation

## 2021-08-26 DIAGNOSIS — S62102A Fracture of unspecified carpal bone, left wrist, initial encounter for closed fracture: Secondary | ICD-10-CM

## 2021-08-26 DIAGNOSIS — S6292XA Unspecified fracture of left wrist and hand, initial encounter for closed fracture: Secondary | ICD-10-CM | POA: Diagnosis not present

## 2021-08-26 DIAGNOSIS — S6992XA Unspecified injury of left wrist, hand and finger(s), initial encounter: Secondary | ICD-10-CM | POA: Diagnosis present

## 2021-08-26 DIAGNOSIS — S52612A Displaced fracture of left ulna styloid process, initial encounter for closed fracture: Secondary | ICD-10-CM | POA: Insufficient documentation

## 2021-08-26 DIAGNOSIS — M7989 Other specified soft tissue disorders: Secondary | ICD-10-CM | POA: Diagnosis not present

## 2021-08-26 DIAGNOSIS — Y9302 Activity, running: Secondary | ICD-10-CM | POA: Insufficient documentation

## 2021-08-26 MED ORDER — ACETAMINOPHEN 325 MG PO TABS
650.0000 mg | ORAL_TABLET | Freq: Once | ORAL | Status: AC
  Administered 2021-08-26: 650 mg via ORAL
  Filled 2021-08-26: qty 2

## 2021-08-26 NOTE — ED Triage Notes (Signed)
Pt here s/p fall after tripping over a rug and placing her L hand out to break her fall. Here now with c/o L. Wrist pain. ?

## 2021-08-26 NOTE — ED Provider Notes (Signed)
? ?[facility]  ?Provider Note ? ?CSN: [id number] ?Arrival date & time: 08/26/21 0214 ? ?History ?Chief Complaint  ?Patient presents with  ? Wrist Pain  ? ? ?Bridget Marquez is a 81 y.o. female reports she tripped over a run and fell onto her outstretched left hand during the night. Complaining of moderate aching pain, worse with movement. No other injuries.  ? ? ?Home Medications ?Prior to Admission medications   ?Medication Sig Start Date End Date Taking? Authorizing Provider  ?acidophilus (RISAQUAD) CAPS capsule Take 1 capsule by mouth daily.    [provider]  ?amoxicillin-clavulanate (AUGMENTIN) 875-125 MG tablet Take 1 tablet by mouth 2 (two) times daily. One po bid x 7 days 08/10/21   Milton Ferguson, MD  ?cholecalciferol (VITAMIN D3) 25 MCG (1000 UNIT) tablet Take 1,000 Units by mouth daily.    [provider]  ?Astrid Drafts 350 MG CAPS Take 350 mg by mouth daily.    [provider]  ?Magnesium 400 MG CAPS Take by mouth.    [provider]  ?Multiple Vitamin (MULTIVITAMIN WITH MINERALS) TABS tablet Take 1 tablet by mouth daily.    [provider]  ?Pomegranate, Punica granatum, (POMEGRANATE PO) Take 1 capsule by mouth daily.    [provider]  ?traMADol (ULTRAM) 50 MG tablet Take 1-2 tablets (50-100 mg total) by mouth every 6 (six) hours as needed for moderate pain. 08/23/20   Armandina Gemma, MD  ?vitamin k 100 MCG tablet Take 100 mcg by mouth daily.    [provider]  ?zinc gluconate 50 MG tablet Take 50 mg by mouth daily.    [provider]  ? ? ? ?Allergies    ?Bactrim [sulfamethoxazole-trimethoprim], Beef-derived products, Ciprofloxacin, Doxycycline hyclate, Tetanus toxoids, Prednisone, and Zithromax [azithromycin] ? ? ?Review of Systems   ?Review of Systems ?Please see HPI for pertinent positives and negatives ? ?Physical Exam ?BP 125/62   Pulse 70   Temp 98.9 ?F (37.2 ?C) (Oral)   Resp 16   Ht '5\' 4"'$  (1.626 m)   Wt  55 kg   SpO2 99%   BMI 20.81 kg/m?  ? ?Physical Exam ?Vitals and nursing note reviewed.  ?HENT:  ?   Head: Normocephalic.  ?   Nose: Nose normal.  ?Eyes:  ?   Extraocular Movements: Extraocular movements intact.  ?Pulmonary:  ?   Effort: Pulmonary effort is normal.  ?Musculoskeletal:     ?   General: Swelling, tenderness and signs of injury (L wrist) present.  ?   Cervical back: Neck supple.  ?   Comments: NVI, no significant deformity  ?Skin: ?   Findings: No rash (on exposed skin).  ?Neurological:  ?   Mental Status: She is alert and oriented to person, place, and time.  ?Psychiatric:     ?   Mood and Affect: Mood normal.  ? ? ?ED Results / Procedures / Treatments   ?EKG ?None ? ?Procedures ?Procedures ? ?Medications Ordered in the ED ?Medications  ?acetaminophen (TYLENOL) tablet 650 mg (has no administration in time range)  ? ? ?Initial Impression and Plan ? Patient will mechanical fall, I personally viewed the images from radiology studies and agree with radiologist interpretation: distal radius/ulna fracture. Patient only wants APAP for pain. Will order a splint and refer to Ortho/Hand for follow up.  ? ? ?ED Course  ? ?  ? ? ?MDM Rules/Calculators/A&P ?Medical Decision Making ?Problems Addressed: ?Closed fracture of left wrist, initial encounter: acute  illness or injury ? ?Amount and/or Complexity of Data Reviewed ?Radiology: ordered and independent interpretation performed. Decision-making details documented in ED Course. ? ?Risk ?OTC drugs. ? ? ? ?Final Clinical Impression(s) / ED Diagnoses ?Final diagnoses:  ?Closed fracture of left wrist, initial encounter  ? ? ?Rx / DC Orders ?ED Discharge Orders   ? ? None  ? ?  ? ?  ?Truddie Hidden, MD ?08/26/21 0630 ? ?

## 2021-08-28 DIAGNOSIS — M25532 Pain in left wrist: Secondary | ICD-10-CM | POA: Diagnosis not present

## 2021-08-28 DIAGNOSIS — M13842 Other specified arthritis, left hand: Secondary | ICD-10-CM | POA: Diagnosis not present

## 2021-08-28 DIAGNOSIS — R52 Pain, unspecified: Secondary | ICD-10-CM | POA: Diagnosis not present

## 2021-08-28 DIAGNOSIS — M13849 Other specified arthritis, unspecified hand: Secondary | ICD-10-CM | POA: Diagnosis not present

## 2021-08-28 DIAGNOSIS — S52502A Unspecified fracture of the lower end of left radius, initial encounter for closed fracture: Secondary | ICD-10-CM | POA: Diagnosis not present

## 2021-09-05 DIAGNOSIS — R52 Pain, unspecified: Secondary | ICD-10-CM | POA: Diagnosis not present

## 2021-09-05 DIAGNOSIS — M25532 Pain in left wrist: Secondary | ICD-10-CM | POA: Diagnosis not present

## 2021-09-05 DIAGNOSIS — S52502A Unspecified fracture of the lower end of left radius, initial encounter for closed fracture: Secondary | ICD-10-CM | POA: Diagnosis not present

## 2021-09-18 DIAGNOSIS — S52502A Unspecified fracture of the lower end of left radius, initial encounter for closed fracture: Secondary | ICD-10-CM | POA: Diagnosis not present

## 2021-09-23 DIAGNOSIS — H6982 Other specified disorders of Eustachian tube, left ear: Secondary | ICD-10-CM | POA: Diagnosis not present

## 2021-09-23 DIAGNOSIS — J189 Pneumonia, unspecified organism: Secondary | ICD-10-CM | POA: Diagnosis not present

## 2021-09-23 DIAGNOSIS — D692 Other nonthrombocytopenic purpura: Secondary | ICD-10-CM | POA: Diagnosis not present

## 2021-09-23 DIAGNOSIS — Z299 Encounter for prophylactic measures, unspecified: Secondary | ICD-10-CM | POA: Diagnosis not present

## 2021-09-23 DIAGNOSIS — Z789 Other specified health status: Secondary | ICD-10-CM | POA: Diagnosis not present

## 2021-10-07 DIAGNOSIS — M25532 Pain in left wrist: Secondary | ICD-10-CM | POA: Diagnosis not present

## 2021-10-07 DIAGNOSIS — S52502A Unspecified fracture of the lower end of left radius, initial encounter for closed fracture: Secondary | ICD-10-CM | POA: Diagnosis not present

## 2021-10-23 DIAGNOSIS — S52502D Unspecified fracture of the lower end of left radius, subsequent encounter for closed fracture with routine healing: Secondary | ICD-10-CM | POA: Diagnosis not present

## 2021-10-23 DIAGNOSIS — M25532 Pain in left wrist: Secondary | ICD-10-CM | POA: Diagnosis not present

## 2021-11-27 DIAGNOSIS — M25532 Pain in left wrist: Secondary | ICD-10-CM | POA: Diagnosis not present

## 2021-11-27 DIAGNOSIS — S52502D Unspecified fracture of the lower end of left radius, subsequent encounter for closed fracture with routine healing: Secondary | ICD-10-CM | POA: Diagnosis not present

## 2021-11-27 DIAGNOSIS — R200 Anesthesia of skin: Secondary | ICD-10-CM | POA: Diagnosis not present

## 2022-01-09 DIAGNOSIS — E7800 Pure hypercholesterolemia, unspecified: Secondary | ICD-10-CM | POA: Diagnosis not present

## 2022-01-09 DIAGNOSIS — Z79899 Other long term (current) drug therapy: Secondary | ICD-10-CM | POA: Diagnosis not present

## 2022-01-09 DIAGNOSIS — Z299 Encounter for prophylactic measures, unspecified: Secondary | ICD-10-CM | POA: Diagnosis not present

## 2022-01-09 DIAGNOSIS — Z6821 Body mass index (BMI) 21.0-21.9, adult: Secondary | ICD-10-CM | POA: Diagnosis not present

## 2022-01-09 DIAGNOSIS — Z7189 Other specified counseling: Secondary | ICD-10-CM | POA: Diagnosis not present

## 2022-01-09 DIAGNOSIS — Z1339 Encounter for screening examination for other mental health and behavioral disorders: Secondary | ICD-10-CM | POA: Diagnosis not present

## 2022-01-09 DIAGNOSIS — R5383 Other fatigue: Secondary | ICD-10-CM | POA: Diagnosis not present

## 2022-01-09 DIAGNOSIS — Z0000 Encounter for general adult medical examination without abnormal findings: Secondary | ICD-10-CM | POA: Diagnosis not present

## 2022-01-09 DIAGNOSIS — Z1331 Encounter for screening for depression: Secondary | ICD-10-CM | POA: Diagnosis not present

## 2022-01-10 DIAGNOSIS — E7800 Pure hypercholesterolemia, unspecified: Secondary | ICD-10-CM | POA: Diagnosis not present

## 2022-01-10 DIAGNOSIS — Z79899 Other long term (current) drug therapy: Secondary | ICD-10-CM | POA: Diagnosis not present

## 2022-01-10 DIAGNOSIS — R5383 Other fatigue: Secondary | ICD-10-CM | POA: Diagnosis not present

## 2022-01-10 DIAGNOSIS — E559 Vitamin D deficiency, unspecified: Secondary | ICD-10-CM | POA: Diagnosis not present

## 2022-03-25 IMAGING — CT CT ANGIO CHEST
2 of 7 series · 16 of 36 positions shown · IV contrast (Omnipaque or Isovue)
Comparison: Current chest radiograph. Prior CTA of the chest,
abdomen and pelvis, 07/22/2018.

CLINICAL DATA: Near syncopal episode.

EXAM:
CT ANGIOGRAPHY CHEST WITH CONTRAST
TECHNIQUE: Multidetector CT imaging of the chest was performed using the
standard protocol during bolus administration of intravenous
contrast. Multiplanar CT image reconstructions and MIPs were
obtained to evaluate the vascular anatomy.

[Series 5: pe axial thins · axial · 0.79mm/px · z∈[+1091,+1420]mm · 15 of 472 slices shown]
[im 30/472  lung]
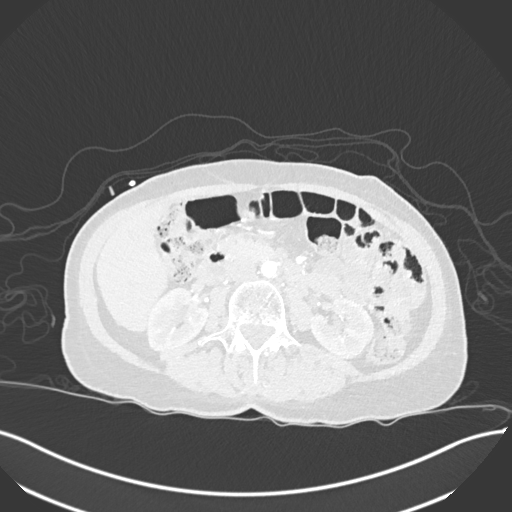
[im 59/472  mediastinal]
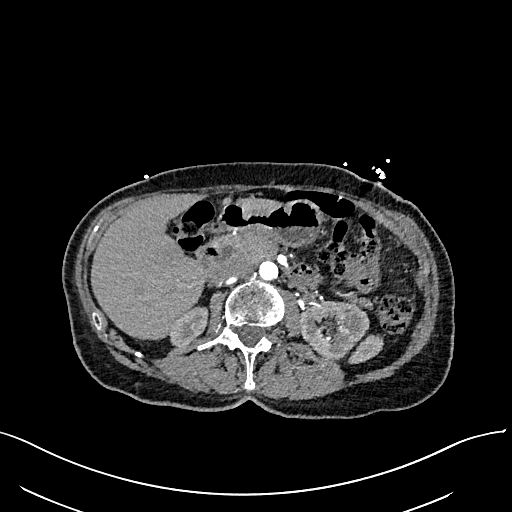
[im 89/472  lung]
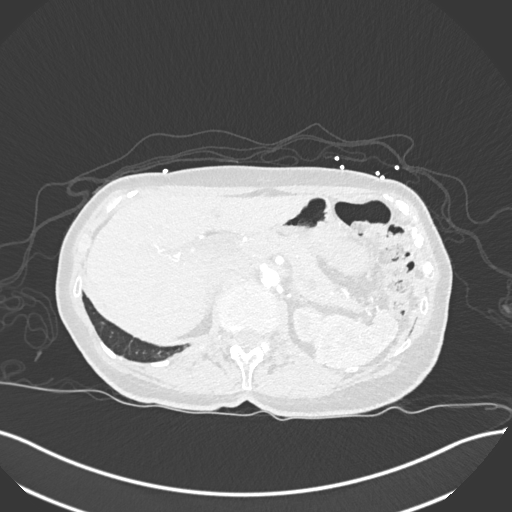
[im 118/472  mediastinal]
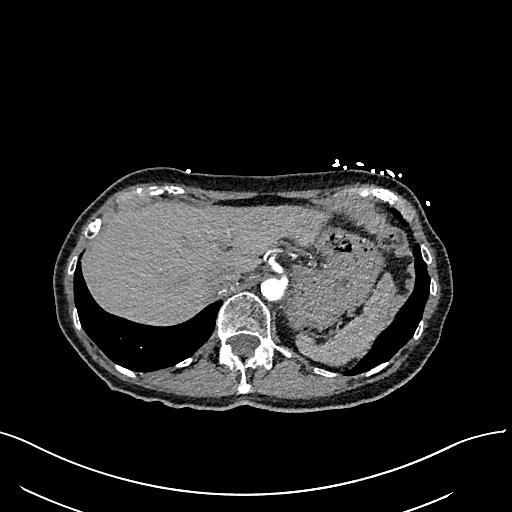
[im 148/472  lung]
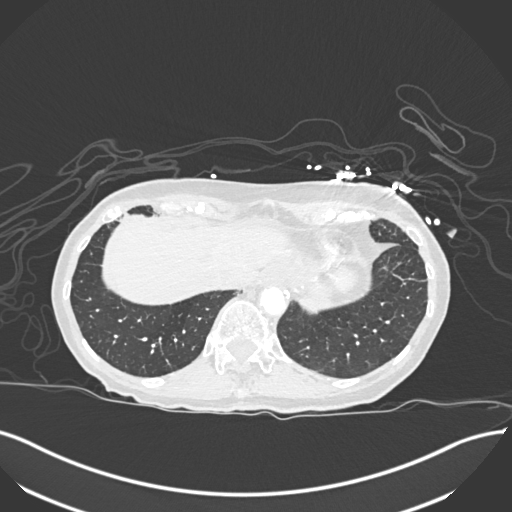
[im 177/472  mediastinal]
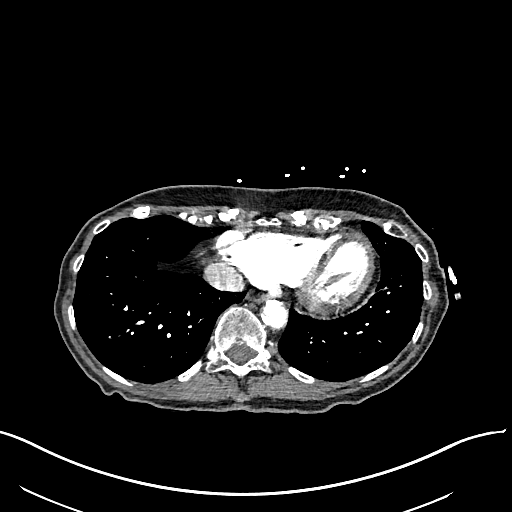
[im 207/472  lung]
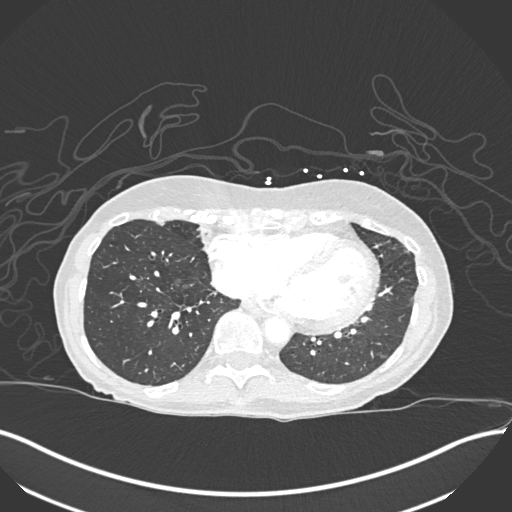
[im 236/472  mediastinal]
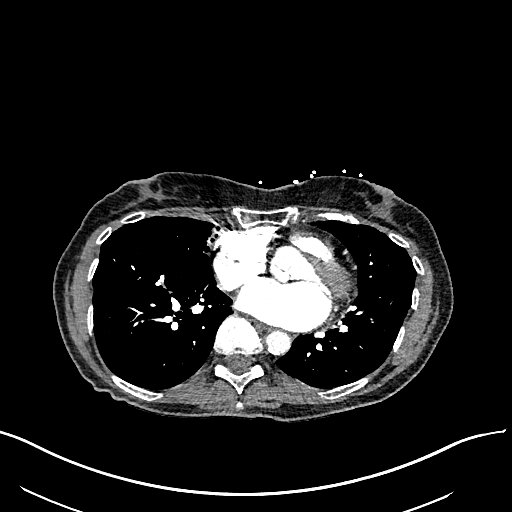
[im 265/472  lung]
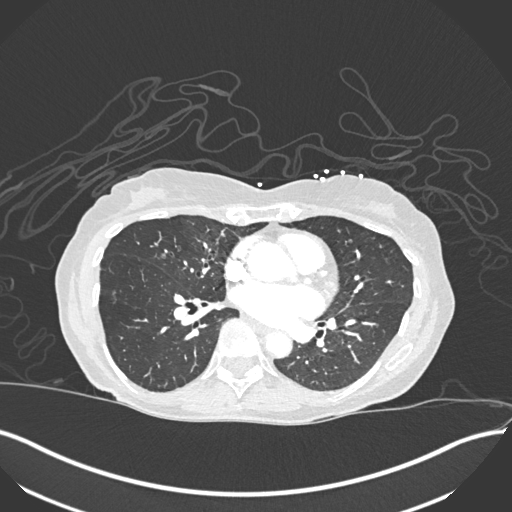
[im 295/472  mediastinal]
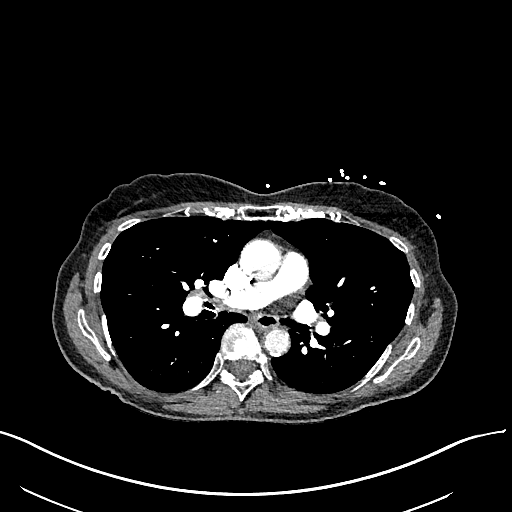
[im 324/472  lung]
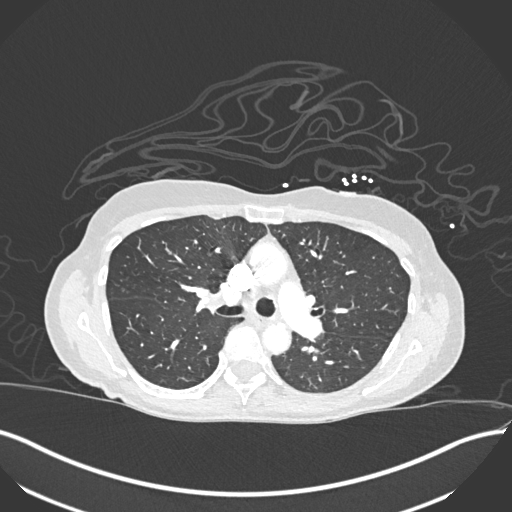
[im 354/472  mediastinal]
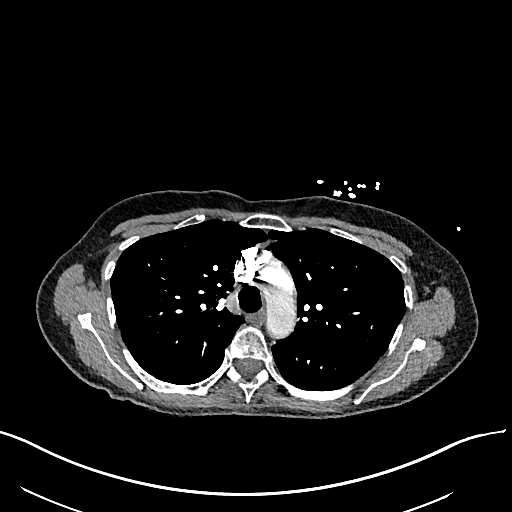
[im 383/472  lung]
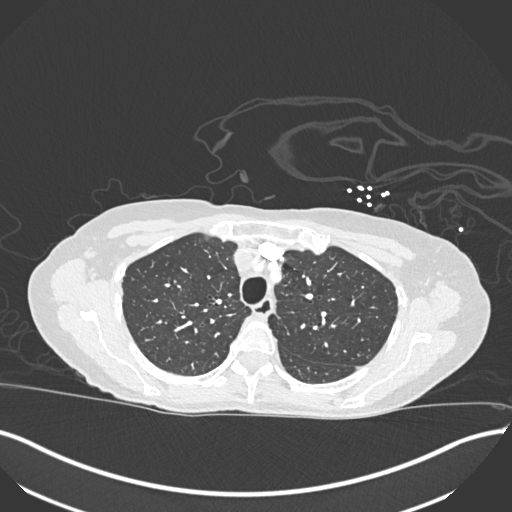
[im 413/472  mediastinal]
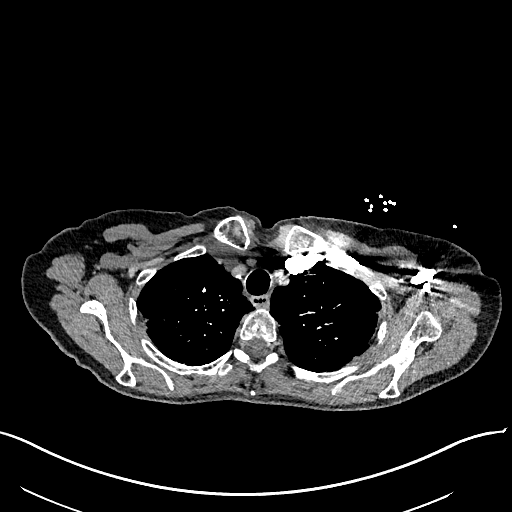
[im 442/472  lung]
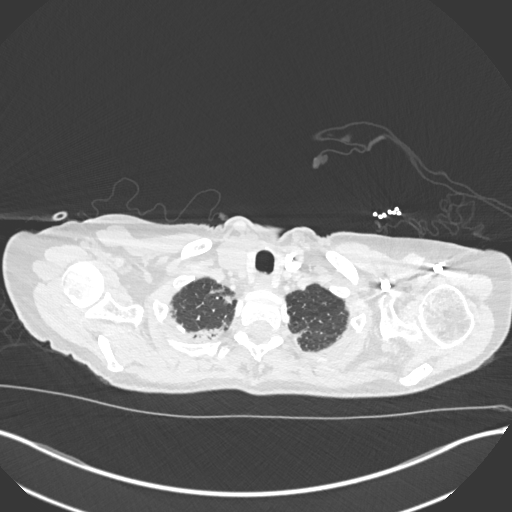

[Series 8: cor soft · coronal · 0.65mm/px · 1 of 112 slices shown]
[im 56/112  mediastinal]
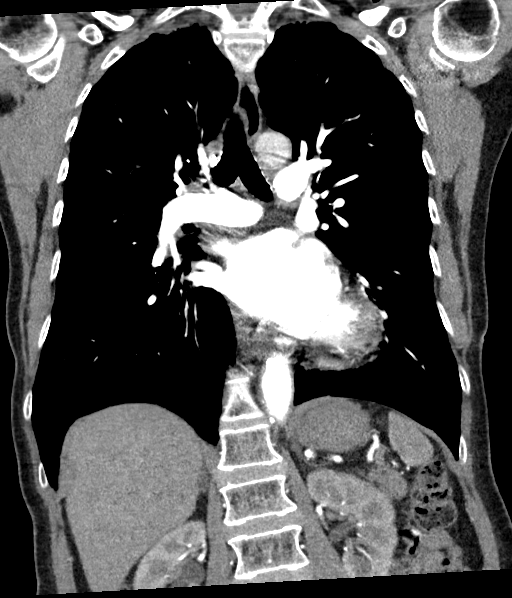

[16 of 36 positions shown; findings below may reference images not displayed]

RADIATION DOSE REDUCTION: This exam was performed according to the
departmental dose-optimization program which includes automated
exposure control, adjustment of the mA and/or kV according to
patient size and/or use of iterative reconstruction technique.

CONTRAST:  75mL OMNIPAQUE IOHEXOL 350 MG/ML SOLN
FINDINGS: Cardiovascular: Pulmonary arteries are well opacified. There is no
evidence of a pulmonary embolism.

Heart is normal in size and configuration. No pericardial effusion.
Great vessels are normal in caliber. Stable aortic atherosclerosis.
No significant stenosis.

Mediastinum/Nodes: No neck base, mediastinal or hilar masses or
enlarged lymph nodes. Trachea and esophagus are unremarkable.

Lungs/Pleura: Focal opacity in the right upper lobe, centered on
image 52, series 6, which appears inflammatory, new since the prior
CT.

Focal opacity with mild associated bronchiectasis in the medial
right middle lobe consistent with scarring, similar to the prior CT.
Small pleural based nodular opacity, left lower lobe, image 118,
series 6, stable from 8383 exam, benign. Few minor areas of
additional reticular opacities noted in the anterior lung bases
consistent with scarring. Stable apical pleuroparenchymal scarring.
Remainder of the lungs is clear.

No pleural effusion or pneumothorax.

Upper Abdomen: No acute abnormality.

Musculoskeletal: No fracture or acute finding.  No bone lesion.

Review of the MIP images confirms the above findings.
IMPRESSION: 1. No evidence of a pulmonary embolism.
2. Small area of focal opacity in the right upper lobe consistent
with infection, new since the prior exam. No other evidence of an
acute abnormality.

Aortic Atherosclerosis ([id number]).

## 2022-03-25 IMAGING — DX DG CHEST 1V PORT
1 series · 1 of 1 positions shown · non-contrast
Comparison: Chest radiograph 07/28/2020

CLINICAL DATA: Weakness, dizziness

EXAM:
PORTABLE CHEST 1 VIEW

[chest ap]
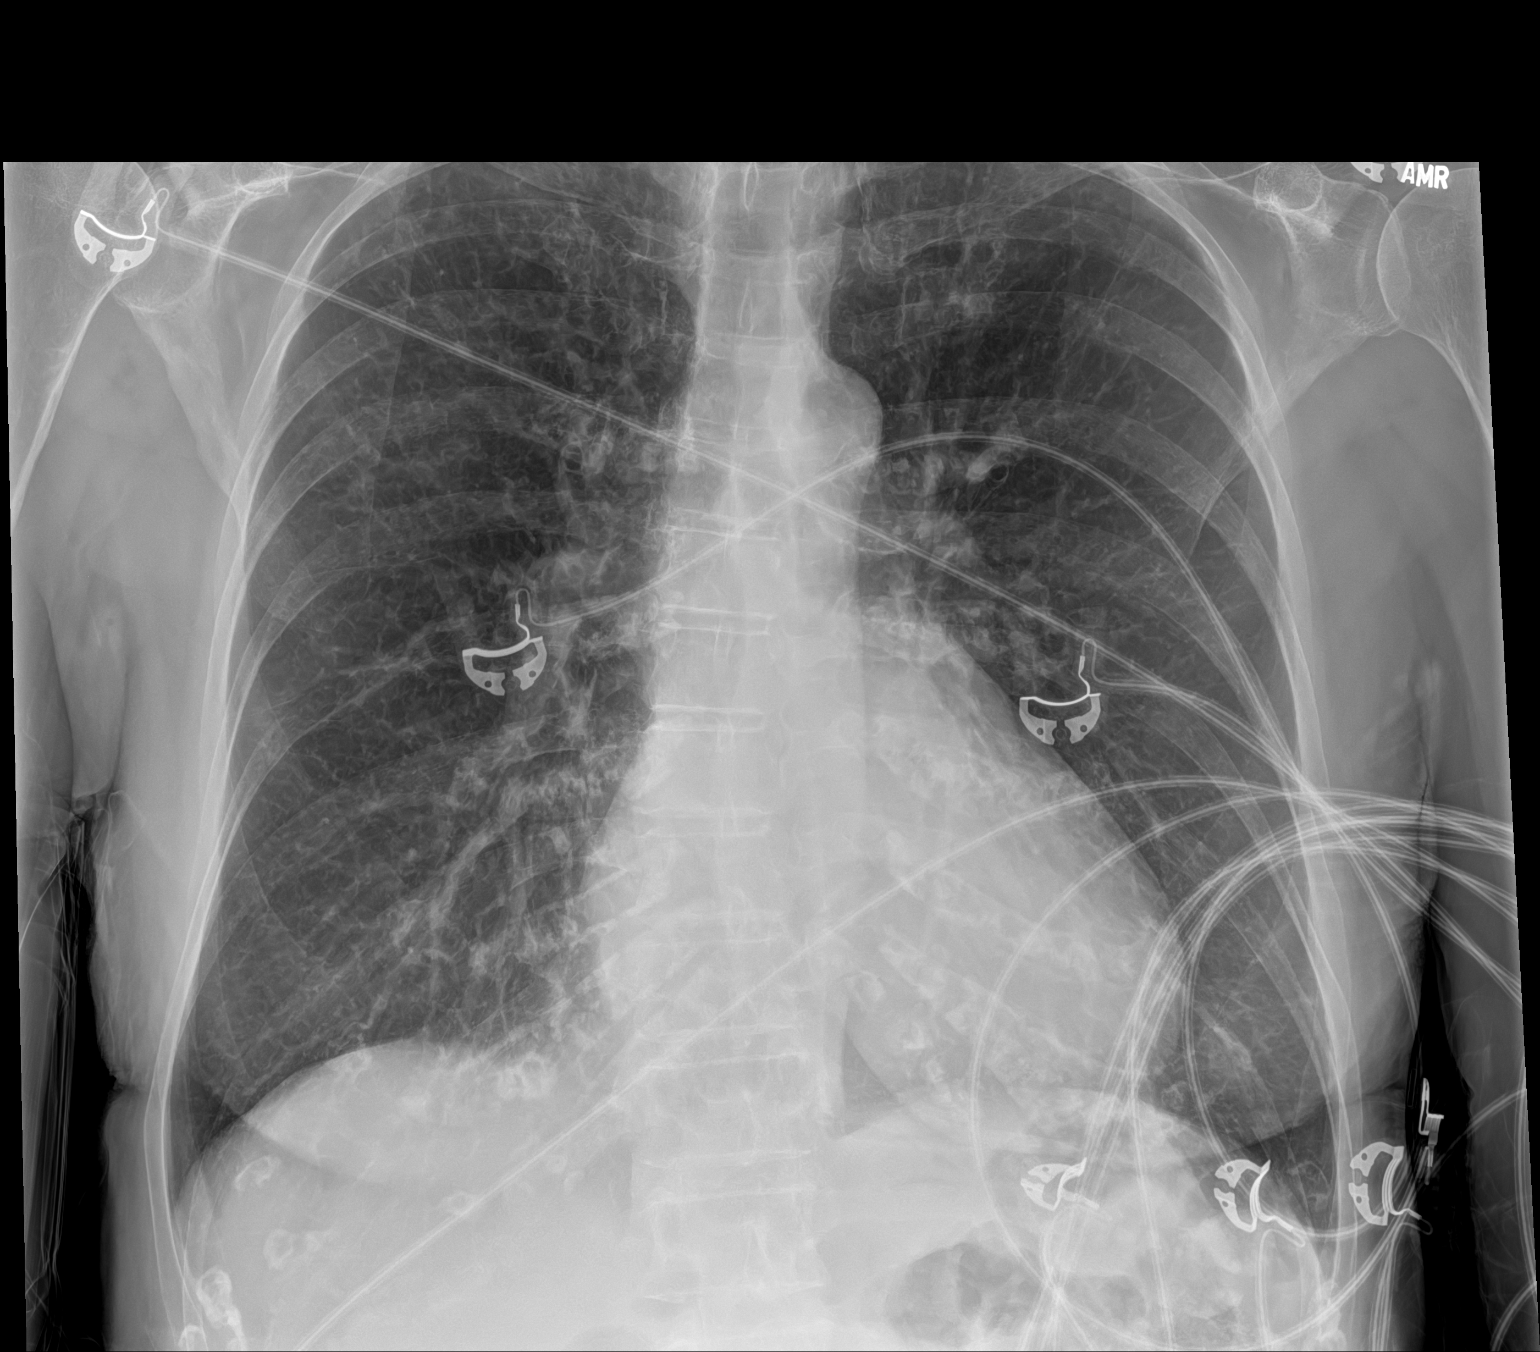

[1 of 1 positions shown; findings below may reference images not displayed]

FINDINGS: The cardiomediastinal silhouette is normal.

There is no focal consolidation or pulmonary edema. There is no
pleural effusion or pneumothorax

There is no acute osseous abnormality.
IMPRESSION: No radiographic evidence of acute cardiopulmonary process.

## 2022-07-14 ENCOUNTER — Other Ambulatory Visit: Payer: Self-pay

## 2022-07-14 ENCOUNTER — Emergency Department (HOSPITAL_COMMUNITY): Payer: Medicare Other

## 2022-07-14 ENCOUNTER — Encounter (HOSPITAL_COMMUNITY): Payer: Self-pay | Admitting: *Deleted

## 2022-07-14 ENCOUNTER — Emergency Department (HOSPITAL_COMMUNITY)
Admission: EM | Admit: 2022-07-14 | Discharge: 2022-07-14 | Disposition: A | Discharge status: Home / Self Care | Payer: Medicare Other | Attending: Emergency Medicine | Admitting: Emergency Medicine

## 2022-07-14 DIAGNOSIS — I700 Atherosclerosis of aorta: Secondary | ICD-10-CM | POA: Insufficient documentation

## 2022-07-14 DIAGNOSIS — J181 Lobar pneumonia, unspecified organism: Secondary | ICD-10-CM | POA: Insufficient documentation

## 2022-07-14 DIAGNOSIS — K862 Cyst of pancreas: Secondary | ICD-10-CM | POA: Diagnosis not present

## 2022-07-14 DIAGNOSIS — R1013 Epigastric pain: Secondary | ICD-10-CM | POA: Diagnosis not present

## 2022-07-14 DIAGNOSIS — R0602 Shortness of breath: Secondary | ICD-10-CM | POA: Insufficient documentation

## 2022-07-14 DIAGNOSIS — R079 Chest pain, unspecified: Secondary | ICD-10-CM | POA: Diagnosis not present

## 2022-07-14 DIAGNOSIS — Z1152 Encounter for screening for COVID-19: Secondary | ICD-10-CM | POA: Diagnosis not present

## 2022-07-14 DIAGNOSIS — R109 Unspecified abdominal pain: Secondary | ICD-10-CM | POA: Diagnosis not present

## 2022-07-14 DIAGNOSIS — R9431 Abnormal electrocardiogram [ECG] [EKG]: Secondary | ICD-10-CM | POA: Diagnosis not present

## 2022-07-14 DIAGNOSIS — K5730 Diverticulosis of large intestine without perforation or abscess without bleeding: Secondary | ICD-10-CM | POA: Diagnosis not present

## 2022-07-14 DIAGNOSIS — J189 Pneumonia, unspecified organism: Secondary | ICD-10-CM

## 2022-07-14 DIAGNOSIS — R0789 Other chest pain: Secondary | ICD-10-CM | POA: Diagnosis not present

## 2022-07-14 LAB — CBC WITH DIFFERENTIAL/PLATELET
Abs Immature Granulocytes: 0.04 10*3/uL (ref 0.00–0.07)
Basophils Absolute: 0 10*3/uL (ref 0.0–0.1)
Basophils Relative: 0 %
Eosinophils Absolute: 0 10*3/uL (ref 0.0–0.5)
Eosinophils Relative: 0 %
HCT: 36.2 % (ref 36.0–46.0)
Hemoglobin: 12.2 g/dL (ref 12.0–15.0)
Immature Granulocytes: 0 %
Lymphocytes Relative: 7 %
Lymphs Abs: 0.8 10*3/uL (ref 0.7–4.0)
MCH: 32.3 pg (ref 26.0–34.0)
MCHC: 33.7 g/dL (ref 30.0–36.0)
MCV: 95.8 fL (ref 80.0–100.0)
Monocytes Absolute: 0.8 10*3/uL (ref 0.1–1.0)
Monocytes Relative: 8 %
Neutro Abs: 8.8 10*3/uL — ABNORMAL HIGH (ref 1.7–7.7)
Neutrophils Relative %: 85 %
Platelets: 218 10*3/uL (ref 150–400)
RBC: 3.78 MIL/uL — ABNORMAL LOW (ref 3.87–5.11)
RDW: 12.6 % (ref 11.5–15.5)
WBC: 10.5 10*3/uL (ref 4.0–10.5)
nRBC: 0 % (ref 0.0–0.2)

## 2022-07-14 LAB — HEPATIC FUNCTION PANEL
ALT: 19 U/L (ref 0–44)
AST: 28 U/L (ref 15–41)
Albumin: 3.8 g/dL (ref 3.5–5.0)
Alkaline Phosphatase: 63 U/L (ref 38–126)
Bilirubin, Direct: 0.1 mg/dL (ref 0.0–0.2)
Indirect Bilirubin: 0.9 mg/dL (ref 0.3–0.9)
Total Bilirubin: 1 mg/dL (ref 0.3–1.2)
Total Protein: 6.5 g/dL (ref 6.5–8.1)

## 2022-07-14 LAB — URINALYSIS, ROUTINE W REFLEX MICROSCOPIC
Bilirubin Urine: NEGATIVE
Glucose, UA: NEGATIVE mg/dL
Hgb urine dipstick: NEGATIVE
Ketones, ur: NEGATIVE mg/dL
Leukocytes,Ua: NEGATIVE
Nitrite: NEGATIVE
Protein, ur: NEGATIVE mg/dL
Specific Gravity, Urine: 1.006 (ref 1.005–1.030)
pH: 9 — ABNORMAL HIGH (ref 5.0–8.0)

## 2022-07-14 LAB — BASIC METABOLIC PANEL
Anion gap: 10 (ref 5–15)
BUN: 21 mg/dL (ref 8–23)
CO2: 28 mmol/L (ref 22–32)
Calcium: 9.2 mg/dL (ref 8.9–10.3)
Chloride: 94 mmol/L — ABNORMAL LOW (ref 98–111)
Creatinine, Ser: 0.9 mg/dL (ref 0.44–1.00)
GFR, Estimated: >60 mL/min (ref >60)
Glucose, Bld: 120 mg/dL — ABNORMAL HIGH (ref 70–99)
Potassium: 3.7 mmol/L (ref 3.5–5.1)
Sodium: 132 mmol/L — ABNORMAL LOW (ref 135–145)

## 2022-07-14 LAB — RESP PANEL BY RT-PCR (RSV, FLU A&B, COVID)  RVPGX2
Influenza A by PCR: NEGATIVE
Influenza B by PCR: NEGATIVE
Resp Syncytial Virus by PCR: NEGATIVE
SARS Coronavirus 2 by RT PCR: NEGATIVE

## 2022-07-14 LAB — TROPONIN I (HIGH SENSITIVITY)
Troponin I (High Sensitivity): 3 ng/L (ref <18)
Troponin I (High Sensitivity): 4 ng/L (ref <18)

## 2022-07-14 LAB — LIPASE, BLOOD: Lipase: 35 U/L (ref 11–51)

## 2022-07-14 LAB — D-DIMER, QUANTITATIVE: D-Dimer, Quant: 0.34 ug/mL-FEU (ref 0.00–0.50)

## 2022-07-14 LAB — BRAIN NATRIURETIC PEPTIDE: B Natriuretic Peptide: 118 pg/mL — ABNORMAL HIGH (ref 0.0–100.0)

## 2022-07-14 MED ORDER — LEVOFLOXACIN 500 MG PO TABS: 500.0000 mg | ORAL_TABLET | Freq: Every day | ORAL | 0 refills | Status: AC

## 2022-07-14 MED ORDER — IOHEXOL 300 MG/ML  SOLN
100.0000 mL | Freq: Once | INTRAMUSCULAR | Status: AC | PRN
  Administered 2022-07-14: 100 mL via INTRAVENOUS

## 2022-07-14 MED ORDER — PANTOPRAZOLE SODIUM 40 MG IV SOLR
40.0000 mg | Freq: Once | INTRAVENOUS | Status: AC
  Administered 2022-07-14: 40 mg via INTRAVENOUS
  Filled 2022-07-14: qty 10

## 2022-07-14 NOTE — ED Provider Notes (Signed)
[place] [facility] AT [facility] Provider Note   CSN: [id number] Arrival date & time: 07/14/22  4098     History  Chief Complaint  Patient presents with   Abdominal Pain    Bridget Marquez is a 82 y.o. female.  Patient has a history of pneumonia.  She comes in today with some chest and abdomen discomfort  The history is provided by the patient and medical records. No language interpreter was used.  Abdominal Pain Pain location:  Epigastric Pain quality: aching   Pain radiates to:  Does not radiate Pain severity:  Mild Timing:  Intermittent Progression:  Waxing and waning Associated symptoms: no chest pain, no cough, no diarrhea, no fatigue and no hematuria        Home Medications Prior to Admission medications   Medication Sig Start Date End Date Taking? Authorizing Provider  acidophilus (RISAQUAD) CAPS capsule Take 1 capsule by mouth daily.   Yes [provider]  cholecalciferol (VITAMIN D3) 25 MCG (1000 UNIT) tablet Take 1,000 Units by mouth daily.   Yes [provider]  Boris Lown Oil 350 MG CAPS Take 350 mg by mouth daily.   Yes [provider]  levofloxacin (LEVAQUIN) 500 MG tablet Take 1 tablet (500 mg total) by mouth daily. 07/14/22  Yes Bethann Berkshire, MD  Multiple Vitamin (MULTIVITAMIN WITH MINERALS) TABS tablet Take 1 tablet by mouth daily.   Yes [provider]  vitamin k 100 MCG tablet Take 100 mcg by mouth daily.   Yes [provider]      Allergies    Bactrim [sulfamethoxazole-trimethoprim], Beef-derived products, Ciprofloxacin, Doxycycline hyclate, Tetanus toxoids, Prednisone, and Zithromax [azithromycin]    Review of Systems   Review of Systems  Constitutional:  Negative for appetite change and fatigue.  HENT:  Negative for congestion, ear discharge and sinus pressure.   Eyes:  Negative for discharge.  Respiratory:  Negative for cough.   Cardiovascular:  Negative for chest pain.   Gastrointestinal:  Positive for abdominal pain. Negative for diarrhea.  Genitourinary:  Negative for frequency and hematuria.  Musculoskeletal:  Negative for back pain.  Skin:  Negative for rash.  Neurological:  Negative for seizures and headaches.  Psychiatric/Behavioral:  Negative for hallucinations.     Physical Exam Updated Vital Signs BP (!) 143/58   Pulse 82   Temp 98.6 F (37 C) (Oral)   Resp (!) 21   Ht 5\' 4"  (1.626 m)   Wt 54.9 kg   SpO2 99%   BMI 20.77 kg/m  Physical Exam Vitals and nursing note reviewed.  Constitutional:      Appearance: She is well-developed.  HENT:     Head: Normocephalic.     Nose: Nose normal.  Eyes:     General: No scleral icterus.    Conjunctiva/sclera: Conjunctivae normal.  Neck:     Thyroid: No thyromegaly.  Cardiovascular:     Rate and Rhythm: Normal rate and regular rhythm.     Heart sounds: No murmur heard.    No friction rub. No gallop.  Pulmonary:     Breath sounds: No stridor. No wheezing or rales.  Chest:     Chest wall: No tenderness.  Abdominal:     General: There is no distension.     Tenderness: There is abdominal tenderness. There is no rebound.  Musculoskeletal:        General: Normal range of motion.     Cervical back: Neck supple.  Lymphadenopathy:  Cervical: No cervical adenopathy.  Skin:    Findings: No erythema or rash.  Neurological:     Mental Status: She is alert and oriented to person, place, and time.     Motor: No abnormal muscle tone.     Coordination: Coordination normal.  Psychiatric:        Behavior: Behavior normal.     ED Results / Procedures / Treatments   Labs (all labs ordered are listed, but only abnormal results are displayed) Labs Reviewed  BASIC METABOLIC PANEL - Abnormal; Notable for the following components:      Result Value   Sodium 132 (*)    Chloride 94 (*)    Glucose, Bld 120 (*)    All other components within normal limits  CBC WITH DIFFERENTIAL/PLATELET -  Abnormal; Notable for the following components:   RBC 3.78 (*)    Neutro Abs 8.8 (*)    All other components within normal limits  BRAIN NATRIURETIC PEPTIDE - Abnormal; Notable for the following components:   B Natriuretic Peptide 118.0 (*)    All other components within normal limits  URINALYSIS, ROUTINE W REFLEX MICROSCOPIC - Abnormal; Notable for the following components:   Color, Urine STRAW (*)    pH 9.0 (*)    All other components within normal limits  RESP PANEL BY RT-PCR (RSV, FLU A&B, COVID)  [id number]  LIPASE, BLOOD  HEPATIC FUNCTION PANEL  D-DIMER, QUANTITATIVE  TROPONIN I (HIGH SENSITIVITY)  TROPONIN I (HIGH SENSITIVITY)    EKG None  Radiology CT ABDOMEN PELVIS W CONTRAST  Result Date: 07/14/2022 CLINICAL DATA:  Epigastric and left rib pain EXAM: CT ABDOMEN AND PELVIS WITH CONTRAST TECHNIQUE: Multidetector CT imaging of the abdomen and pelvis was performed using the standard protocol following bolus administration of intravenous contrast. RADIATION DOSE REDUCTION: This exam was performed according to the departmental dose-optimization program which includes automated exposure control, adjustment of the mA and/or kV according to patient size and/or use of iterative reconstruction technique. CONTRAST:  OMNIPAQUE IOHEXOL 300 MG/ML  SOLN COMPARISON:  CTA chest dated 08/10/2021, CTA abdomen and pelvis dated 07/22/2018 FINDINGS: Lower chest: Right middle lobe and right lower lobe tree-in-bud and ground-glass nodules. Medial right lower lobe subsegmental atelectasis and bronchiectasis unchanged. Bibasilar linear atelectasis/scarring. No pleural effusion or pneumothorax demonstrated. Partially imaged heart size is normal. Hepatobiliary: Subcentimeter segment 2 hypodensity (2:15), too small to characterize but unchanged from 07/22/2018. No intra or extrahepatic biliary ductal dilation. Cholecystectomy. Pancreas: 9 mm hypodensity in the pancreatic body (2:21), unchanged from 07/22/2018.  No main ductal dilation, mass lesion, or abnormal enhancement. Spleen: Normal in size without focal abnormality. Adrenals/Urinary Tract: No adrenal nodules. No suspicious renal mass, calculi or hydronephrosis. No focal bladder wall thickening. Stomach/Bowel: Normal appearance of the stomach. No evidence of bowel wall thickening, distention, or inflammatory changes. Colonic diverticulosis without acute diverticulitis. Appendectomy. Vascular/Lymphatic: Aortic atherosclerosis. Prominent cross pelvic collaterals. No enlarged abdominal or pelvic lymph nodes. Reproductive: No adnexal masses. Other: No free fluid, fluid collection, or free air. Musculoskeletal: No acute or abnormal lytic or blastic osseous lesions. Soft tissue granulomas in the gluteal regions bilaterally. IMPRESSION: 1. Right middle lobe and right lower lobe tree-in-bud and ground-glass nodules suggestive of infection/inflammation. 2. Partially imaged left lower ribs without focal abnormality. 3. No acute abdominopelvic abnormality. 4. Prominent cross pelvic collaterals. Correlate with signs and symptoms of pelvic congestion syndrome. 5. Stable 9 mm hypodensity in the pancreatic body dating back to 07/22/2018, may represent a side branch IPMN. Recommend  follow-up MRI/MRCP in 2 years. This recommendation follows ACR consensus guidelines: Management of Incidental Pancreatic Cysts: A White Paper of the ACR Incidental Findings Committee. J Am Coll Radiol 2017;14:911-923. 6. Aortic Atherosclerosis (ICD10-I70.0). Electronically Signed   By: Agustin Cree M.D.   On: 07/14/2022 15:24   DG Chest 2 View  Result Date: 07/14/2022 CLINICAL DATA:  Acute epigastric abdominal pain. EXAM: CHEST - 2 VIEW COMPARISON:  August 10, 2021. FINDINGS: The heart size and mediastinal contours are within normal limits. Both lungs are clear. The visualized skeletal structures are unremarkable. IMPRESSION: No active cardiopulmonary disease. Electronically Signed   By: Lupita Raider  M.D.   On: 07/14/2022 09:10    Procedures Procedures    Medications Ordered in ED Medications  pantoprazole (PROTONIX) injection 40 mg (40 mg Intravenous Given 07/14/22 1418)  iohexol (OMNIPAQUE) 300 MG/ML solution 100 mL (100 mLs Intravenous Contrast Given 07/14/22 1454)    ED Course/ Medical Decision Making/ A&P                             Medical Decision Making Amount and/or Complexity of Data Reviewed Labs: ordered. Radiology: ordered.  Risk Prescription drug management.  This patient presents to the ED for concern of epigastric pain and chest pain, this involves an extensive number of treatment options, and is a complaint that carries with it a high risk of complications and morbidity.  The differential diagnosis includes enteritis, gallbladder disease   Co morbidities that complicate the patient evaluation  History of pneumonia   Additional history obtained:  Additional history obtained from husband External records from outside source obtained and reviewed including hospital records   Lab Tests:  I Ordered, and personally interpreted labs.  The pertinent results include: BC and chemistries unremarkable   Imaging Studies ordered:  I ordered imaging studies including CT abdomen I independently visualized and interpreted imaging which showed right lower lobe and right middle lobe infectious process I agree with the radiologist interpretation   Cardiac Monitoring: / EKG:  The patient was maintained on a cardiac monitor.  I personally viewed and interpreted the cardiac monitored which showed an underlying rhythm of: Normal sinus rhythm   Consultations Obtained:  No consultant  Problem List / ED Course / Critical interventions / Medication management  Pneumonia I ordered medication including Levaquin for pneumonia Reevaluation of the patient after these medicines showed that the patient stayed the same I have reviewed the patients home medicines and  have made adjustments as needed   Social Determinants of Health:  None   Test / Admission - Considered:  None  Patient with community-acquired pneumonia.  He is placed on Levaquin and will follow-up with her PCP        Final Clinical Impression(s) / ED Diagnoses Final diagnoses:  Community acquired pneumonia of right lower lobe of lung    Rx / DC Orders ED Discharge Orders          Ordered    levofloxacin (LEVAQUIN) 500 MG tablet  Daily        07/14/22 1540              Bethann Berkshire, MD 07/15/22 [phone]

## 2022-07-14 NOTE — ED Triage Notes (Signed)
Pt c/o epigastric pain and left rib pain; pt states she was unable to sleep last night due to the pain  Pt c/o increased sob and states she had multiple BMs last night

## 2022-07-14 NOTE — Discharge Instructions (Signed)
Follow-up with your family doctor later this week for recheck.  Return if any problems

## 2022-07-18 DIAGNOSIS — I700 Atherosclerosis of aorta: Secondary | ICD-10-CM | POA: Diagnosis not present

## 2022-07-18 DIAGNOSIS — Z299 Encounter for prophylactic measures, unspecified: Secondary | ICD-10-CM | POA: Diagnosis not present

## 2022-07-18 DIAGNOSIS — D692 Other nonthrombocytopenic purpura: Secondary | ICD-10-CM | POA: Diagnosis not present

## 2022-07-18 DIAGNOSIS — J189 Pneumonia, unspecified organism: Secondary | ICD-10-CM | POA: Diagnosis not present

## 2022-07-18 DIAGNOSIS — I739 Peripheral vascular disease, unspecified: Secondary | ICD-10-CM | POA: Diagnosis not present

## 2022-08-14 DIAGNOSIS — Z1283 Encounter for screening for malignant neoplasm of skin: Secondary | ICD-10-CM | POA: Diagnosis not present

## 2022-08-14 DIAGNOSIS — Z85828 Personal history of other malignant neoplasm of skin: Secondary | ICD-10-CM | POA: Diagnosis not present

## 2022-08-14 DIAGNOSIS — L821 Other seborrheic keratosis: Secondary | ICD-10-CM | POA: Diagnosis not present

## 2022-08-14 DIAGNOSIS — D485 Neoplasm of uncertain behavior of skin: Secondary | ICD-10-CM | POA: Diagnosis not present

## 2022-08-14 DIAGNOSIS — C44519 Basal cell carcinoma of skin of other part of trunk: Secondary | ICD-10-CM | POA: Diagnosis not present

## 2022-08-14 DIAGNOSIS — L570 Actinic keratosis: Secondary | ICD-10-CM | POA: Diagnosis not present

## 2022-08-14 DIAGNOSIS — L568 Other specified acute skin changes due to ultraviolet radiation: Secondary | ICD-10-CM | POA: Diagnosis not present

## 2022-08-14 DIAGNOSIS — D1801 Hemangioma of skin and subcutaneous tissue: Secondary | ICD-10-CM | POA: Diagnosis not present

## 2022-08-14 DIAGNOSIS — Z08 Encounter for follow-up examination after completed treatment for malignant neoplasm: Secondary | ICD-10-CM | POA: Diagnosis not present

## 2022-08-25 DIAGNOSIS — H2513 Age-related nuclear cataract, bilateral: Secondary | ICD-10-CM | POA: Diagnosis not present

## 2022-08-25 DIAGNOSIS — H524 Presbyopia: Secondary | ICD-10-CM | POA: Diagnosis not present

## 2022-08-25 DIAGNOSIS — H52223 Regular astigmatism, bilateral: Secondary | ICD-10-CM | POA: Diagnosis not present

## 2022-08-25 DIAGNOSIS — H5203 Hypermetropia, bilateral: Secondary | ICD-10-CM | POA: Diagnosis not present

## 2022-09-18 DIAGNOSIS — C44519 Basal cell carcinoma of skin of other part of trunk: Secondary | ICD-10-CM | POA: Diagnosis not present

## 2022-11-06 DIAGNOSIS — T07XXXA Unspecified multiple injuries, initial encounter: Secondary | ICD-10-CM | POA: Diagnosis not present

## 2022-11-06 DIAGNOSIS — L910 Hypertrophic scar: Secondary | ICD-10-CM | POA: Diagnosis not present

## 2022-11-24 DIAGNOSIS — N952 Postmenopausal atrophic vaginitis: Secondary | ICD-10-CM | POA: Diagnosis not present

## 2022-11-24 DIAGNOSIS — R300 Dysuria: Secondary | ICD-10-CM | POA: Diagnosis not present

## 2022-11-24 DIAGNOSIS — R309 Painful micturition, unspecified: Secondary | ICD-10-CM | POA: Diagnosis not present

## 2022-11-24 DIAGNOSIS — N950 Postmenopausal bleeding: Secondary | ICD-10-CM | POA: Diagnosis not present

## 2022-11-24 DIAGNOSIS — N898 Other specified noninflammatory disorders of vagina: Secondary | ICD-10-CM | POA: Diagnosis not present

## 2023-01-07 DIAGNOSIS — R350 Frequency of micturition: Secondary | ICD-10-CM | POA: Diagnosis not present

## 2023-01-07 DIAGNOSIS — Z299 Encounter for prophylactic measures, unspecified: Secondary | ICD-10-CM | POA: Diagnosis not present

## 2023-01-07 DIAGNOSIS — N390 Urinary tract infection, site not specified: Secondary | ICD-10-CM | POA: Diagnosis not present

## 2023-01-14 DIAGNOSIS — R5383 Other fatigue: Secondary | ICD-10-CM | POA: Diagnosis not present

## 2023-01-14 DIAGNOSIS — Z1339 Encounter for screening examination for other mental health and behavioral disorders: Secondary | ICD-10-CM | POA: Diagnosis not present

## 2023-01-14 DIAGNOSIS — Z79899 Other long term (current) drug therapy: Secondary | ICD-10-CM | POA: Diagnosis not present

## 2023-01-14 DIAGNOSIS — Z7189 Other specified counseling: Secondary | ICD-10-CM | POA: Diagnosis not present

## 2023-01-14 DIAGNOSIS — Z0000 Encounter for general adult medical examination without abnormal findings: Secondary | ICD-10-CM | POA: Diagnosis not present

## 2023-01-14 DIAGNOSIS — E7800 Pure hypercholesterolemia, unspecified: Secondary | ICD-10-CM | POA: Diagnosis not present

## 2023-01-14 DIAGNOSIS — Z299 Encounter for prophylactic measures, unspecified: Secondary | ICD-10-CM | POA: Diagnosis not present

## 2023-01-14 DIAGNOSIS — Z1331 Encounter for screening for depression: Secondary | ICD-10-CM | POA: Diagnosis not present

## 2023-01-15 DIAGNOSIS — E559 Vitamin D deficiency, unspecified: Secondary | ICD-10-CM | POA: Diagnosis not present

## 2023-01-15 DIAGNOSIS — Z79899 Other long term (current) drug therapy: Secondary | ICD-10-CM | POA: Diagnosis not present

## 2023-01-15 DIAGNOSIS — E7800 Pure hypercholesterolemia, unspecified: Secondary | ICD-10-CM | POA: Diagnosis not present

## 2023-01-15 DIAGNOSIS — R5383 Other fatigue: Secondary | ICD-10-CM | POA: Diagnosis not present

## 2023-06-23 DIAGNOSIS — Z299 Encounter for prophylactic measures, unspecified: Secondary | ICD-10-CM | POA: Diagnosis not present

## 2023-06-23 DIAGNOSIS — R110 Nausea: Secondary | ICD-10-CM | POA: Diagnosis not present

## 2023-06-23 DIAGNOSIS — J069 Acute upper respiratory infection, unspecified: Secondary | ICD-10-CM | POA: Diagnosis not present

## 2023-06-29 DIAGNOSIS — J069 Acute upper respiratory infection, unspecified: Secondary | ICD-10-CM | POA: Diagnosis not present

## 2023-06-29 DIAGNOSIS — R5383 Other fatigue: Secondary | ICD-10-CM | POA: Diagnosis not present

## 2023-07-17 DIAGNOSIS — Z299 Encounter for prophylactic measures, unspecified: Secondary | ICD-10-CM | POA: Diagnosis not present

## 2023-07-17 DIAGNOSIS — R64 Cachexia: Secondary | ICD-10-CM | POA: Diagnosis not present

## 2023-07-17 DIAGNOSIS — I779 Disorder of arteries and arterioles, unspecified: Secondary | ICD-10-CM | POA: Diagnosis not present

## 2023-07-17 DIAGNOSIS — I700 Atherosclerosis of aorta: Secondary | ICD-10-CM | POA: Diagnosis not present

## 2023-07-17 DIAGNOSIS — I739 Peripheral vascular disease, unspecified: Secondary | ICD-10-CM | POA: Diagnosis not present

## 2023-07-17 DIAGNOSIS — J069 Acute upper respiratory infection, unspecified: Secondary | ICD-10-CM | POA: Diagnosis not present

## 2023-09-08 DIAGNOSIS — T1512XA Foreign body in conjunctival sac, left eye, initial encounter: Secondary | ICD-10-CM | POA: Diagnosis not present

## 2023-09-08 DIAGNOSIS — H02054 Trichiasis without entropian left upper eyelid: Secondary | ICD-10-CM | POA: Diagnosis not present

## 2023-11-17 DIAGNOSIS — H40023 Open angle with borderline findings, high risk, bilateral: Secondary | ICD-10-CM | POA: Diagnosis not present

## 2023-11-17 DIAGNOSIS — H52223 Regular astigmatism, bilateral: Secondary | ICD-10-CM | POA: Diagnosis not present

## 2023-11-17 DIAGNOSIS — H524 Presbyopia: Secondary | ICD-10-CM | POA: Diagnosis not present

## 2023-11-17 DIAGNOSIS — H5203 Hypermetropia, bilateral: Secondary | ICD-10-CM | POA: Diagnosis not present

## 2023-11-17 DIAGNOSIS — H25813 Combined forms of age-related cataract, bilateral: Secondary | ICD-10-CM | POA: Diagnosis not present

## 2023-12-11 DIAGNOSIS — N644 Mastodynia: Secondary | ICD-10-CM | POA: Diagnosis not present

## 2023-12-22 DIAGNOSIS — Z1339 Encounter for screening examination for other mental health and behavioral disorders: Secondary | ICD-10-CM | POA: Diagnosis not present

## 2023-12-22 DIAGNOSIS — Z79899 Other long term (current) drug therapy: Secondary | ICD-10-CM | POA: Diagnosis not present

## 2023-12-22 DIAGNOSIS — E7800 Pure hypercholesterolemia, unspecified: Secondary | ICD-10-CM | POA: Diagnosis not present

## 2023-12-22 DIAGNOSIS — R5383 Other fatigue: Secondary | ICD-10-CM | POA: Diagnosis not present

## 2023-12-22 DIAGNOSIS — Z299 Encounter for prophylactic measures, unspecified: Secondary | ICD-10-CM | POA: Diagnosis not present

## 2023-12-22 DIAGNOSIS — Z7189 Other specified counseling: Secondary | ICD-10-CM | POA: Diagnosis not present

## 2023-12-22 DIAGNOSIS — Z0000 Encounter for general adult medical examination without abnormal findings: Secondary | ICD-10-CM | POA: Diagnosis not present

## 2023-12-22 DIAGNOSIS — Z1331 Encounter for screening for depression: Secondary | ICD-10-CM | POA: Diagnosis not present

## 2023-12-23 DIAGNOSIS — E559 Vitamin D deficiency, unspecified: Secondary | ICD-10-CM | POA: Diagnosis not present

## 2023-12-23 DIAGNOSIS — R5383 Other fatigue: Secondary | ICD-10-CM | POA: Diagnosis not present

## 2023-12-23 DIAGNOSIS — E7800 Pure hypercholesterolemia, unspecified: Secondary | ICD-10-CM | POA: Diagnosis not present

## 2023-12-23 DIAGNOSIS — Z79899 Other long term (current) drug therapy: Secondary | ICD-10-CM | POA: Diagnosis not present
